# Patient Record
Sex: Female | Born: 1995 | Race: Black or African American | Hispanic: No | Marital: Single | Smoking: Never smoker
Health system: Southern US, Community
[De-identification: ages and names within clinical notes are randomized; demographics above are authoritative.]

## PROBLEM LIST (undated history)

## (undated) ENCOUNTER — Inpatient Hospital Stay (HOSPITAL_COMMUNITY): Payer: Self-pay

## (undated) ENCOUNTER — Ambulatory Visit (HOSPITAL_COMMUNITY)

## (undated) ENCOUNTER — Emergency Department (HOSPITAL_BASED_OUTPATIENT_CLINIC_OR_DEPARTMENT_OTHER): Admission: EM | Payer: Medicare (Managed Care)

## (undated) DIAGNOSIS — F419 Anxiety disorder, unspecified: Secondary | ICD-10-CM

## (undated) DIAGNOSIS — D649 Anemia, unspecified: Secondary | ICD-10-CM

## (undated) DIAGNOSIS — K219 Gastro-esophageal reflux disease without esophagitis: Secondary | ICD-10-CM

## (undated) DIAGNOSIS — R55 Syncope and collapse: Secondary | ICD-10-CM

## (undated) DIAGNOSIS — J029 Acute pharyngitis, unspecified: Secondary | ICD-10-CM

## (undated) DIAGNOSIS — G4730 Sleep apnea, unspecified: Secondary | ICD-10-CM

## (undated) DIAGNOSIS — G43909 Migraine, unspecified, not intractable, without status migrainosus: Secondary | ICD-10-CM

## (undated) DIAGNOSIS — H903 Sensorineural hearing loss, bilateral: Secondary | ICD-10-CM

## (undated) DIAGNOSIS — R509 Fever, unspecified: Secondary | ICD-10-CM

## (undated) DIAGNOSIS — N83209 Unspecified ovarian cyst, unspecified side: Secondary | ICD-10-CM

## (undated) DIAGNOSIS — B999 Unspecified infectious disease: Secondary | ICD-10-CM

## (undated) DIAGNOSIS — Z9621 Cochlear implant status: Secondary | ICD-10-CM

## (undated) HISTORY — PX: COCHLEAR IMPLANT: SUR684

## (undated) HISTORY — DX: Acute pharyngitis, unspecified: J02.9

## (undated) HISTORY — DX: Anemia, unspecified: D64.9

## (undated) HISTORY — DX: Gastro-esophageal reflux disease without esophagitis: K21.9

## (undated) HISTORY — DX: Sleep apnea, unspecified: G47.30

## (undated) HISTORY — DX: Anxiety disorder, unspecified: F41.9

## (undated) HISTORY — DX: Syncope and collapse: R55

## (undated) HISTORY — DX: Fever, unspecified: R50.9

---
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
## 2013-01-01 ENCOUNTER — Other Ambulatory Visit: Payer: Self-pay | Admitting: Licensed Clinical Social Worker

## 2013-01-01 ENCOUNTER — Inpatient Hospital Stay (HOSPITAL_COMMUNITY)

## 2013-01-01 ENCOUNTER — Encounter (HOSPITAL_COMMUNITY): Payer: Self-pay | Admitting: Radiology

## 2013-01-01 DIAGNOSIS — R509 Fever, unspecified: Secondary | ICD-10-CM

## 2013-01-01 DIAGNOSIS — Z9621 Cochlear implant status: Secondary | ICD-10-CM

## 2013-01-01 DIAGNOSIS — J029 Acute pharyngitis, unspecified: Secondary | ICD-10-CM

## 2013-01-01 DIAGNOSIS — R519 Headache, unspecified: Secondary | ICD-10-CM

## 2013-01-01 DIAGNOSIS — R55 Syncope and collapse: Secondary | ICD-10-CM

## 2013-01-01 DIAGNOSIS — B370 Candidal stomatitis: Secondary | ICD-10-CM

## 2013-01-01 DIAGNOSIS — R51 Headache: Secondary | ICD-10-CM

## 2013-01-01 DIAGNOSIS — H9209 Otalgia, unspecified ear: Secondary | ICD-10-CM

## 2013-01-01 HISTORY — DX: Acute pharyngitis, unspecified: J02.9

## 2013-01-01 HISTORY — DX: Fever, unspecified: R50.9

## 2013-01-01 HISTORY — DX: Syncope and collapse: R55

## 2013-01-01 LAB — CBC
Hemoglobin: 10.6 g/dL — ABNORMAL LOW (ref 12.0–15.0)
MCH: 28.9 pg (ref 26.0–34.0)
MCHC: 32.1 g/dL (ref 30.0–36.0)
MCV: 89.9 fL (ref 78.0–100.0)

## 2013-01-01 LAB — RHEUMATOID FACTOR: Rhuematoid fact SerPl-aCnc: 17 IU/mL — ABNORMAL HIGH (ref <=14)

## 2013-01-01 LAB — CULTURE, GROUP A STREP

## 2013-01-01 LAB — C-REACTIVE PROTEIN: CRP: 17.1 mg/dL — ABNORMAL HIGH (ref <0.60)

## 2013-01-01 LAB — BASIC METABOLIC PANEL
BUN: 4 mg/dL — ABNORMAL LOW (ref 6–23)
CO2: 24 mEq/L (ref 19–32)
Calcium: 8.4 mg/dL (ref 8.4–10.5)
Creatinine, Ser: 0.91 mg/dL (ref 0.50–1.10)
GFR calc non Af Amer: >90 mL/min (ref >90)
Glucose, Bld: 95 mg/dL (ref 70–99)
Sodium: 135 mEq/L (ref 135–145)

## 2013-01-01 LAB — ROCKY MTN SPOTTED FVR AB, IGG-BLOOD: RMSF IgG: 0.35 IV

## 2013-01-01 LAB — ROCKY MTN SPOTTED FVR AB, IGM-BLOOD: RMSF IgM: 0.36 IV (ref 0.00–0.89)

## 2013-01-01 MED ORDER — IOHEXOL 300 MG/ML  SOLN
100.0000 mL | Freq: Once | INTRAMUSCULAR | Status: AC | PRN
  Administered 2013-01-01: 100 mL via INTRAVENOUS

## 2013-01-01 MED ORDER — IBUPROFEN 600 MG PO TABS: 600.0000 mg | ORAL_TABLET | ORAL | Status: DC | PRN

## 2013-01-01 MED ORDER — POTASSIUM CHLORIDE CRYS ER 20 MEQ PO TBCR
40.0000 meq | EXTENDED_RELEASE_TABLET | Freq: Once | ORAL | Status: AC
  Administered 2013-01-01: 40 meq via ORAL
  Filled 2013-01-01: qty 2

## 2013-01-01 MED ORDER — LEVOFLOXACIN 750 MG PO TABS
750.0000 mg | ORAL_TABLET | Freq: Every day | ORAL | Status: DC
  Administered 2013-01-01: 750 mg via ORAL
  Filled 2013-01-01: qty 1

## 2013-01-01 MED ORDER — FLUCONAZOLE 200 MG PO TABS
200.0000 mg | ORAL_TABLET | Freq: Every day | ORAL | Status: DC
  Administered 2013-01-01: 200 mg via ORAL
  Filled 2013-01-01: qty 1

## 2013-01-01 MED ORDER — FLUCONAZOLE 200 MG PO TABS: 200.0000 mg | ORAL_TABLET | Freq: Every day | ORAL | Status: DC

## 2013-01-01 MED ORDER — LEVOFLOXACIN 750 MG PO TABS: 750.0000 mg | ORAL_TABLET | Freq: Every day | ORAL | Status: DC

## 2013-01-01 MED ORDER — OXYCODONE-ACETAMINOPHEN 5-325 MG PO TABS: 1.0000 | ORAL_TABLET | ORAL | Status: DC | PRN

## 2013-01-01 NOTE — Consult Note (Signed)
[facility]    Date of Admission:  12/30/2012  Date of Consult:  01/01/2013  Reason for Consult: FUO Referring Physician: Dr. Aldine Contes   HPI: Michele Dixon is an 17 y.o. female with hx of Cochlear implants right side, deafness, who had a week history of malaise, sinus congestion and a sore throat. She was attending a ceremony in her church for her upcoming [employer] graduation when she began feeling worsening malaise, dizziness, headache, light headedness. She began trying to go to the restroom to get out of everyones way when she had a frank syncopal event with collpase. She came to [facility] ED and was found to be febrile to 103.1. On exam she had exudative pharyngitis and Group A strep rapid done negative, along with cultures. She c./o swollen LN in her cervical chain and pain along her cochlear implant and in her right ear which was found to have erythematous canal on exam in ED. She had CXR done, unrevealing, UA neg, blood and urine cultures taken which were negative.  Monspot was negative. She had CT chest abdomen and pelvis all of whiich were completely unremarkable. She had HIV EIA done negative. RMSF IGG negative. Her labs were significant for leukocytosis to 17.6 kl, with PMN predominance, which improved to 14.8k without abx. Yesterday she was started on vancomycin and zosyn prior to imaging and continued overnight. Her fever curve has also improved though she is getting tylenol and ibuprofen. She still has sore throat and difficulty swallowing. She is anxious to attend her [employer] graduation ceremony tomorrow and has very strong desire to be dc today from the the hospital.  I spent greater than 60 minutes with the patient including greater than 50% of time in face to face counsel of the patient with mother as translator in sign language re FUO and her clinical condition  and in coordination of their care.    Past Medical History  Diagnosis Date  . Migraines     . Cochlear implant in place     Past Surgical History  Procedure Laterality Date  . Cochlear implant    ergies:   No Known Allergies   Medications: I have reviewed patients current medications as documented in Epic Anti-infectives   Start     Dose/Rate Route Frequency Ordered Stop   01/01/13 1400  levofloxacin (LEVAQUIN) tablet 750 mg     750 mg Oral Daily 01/01/13 1130     01/01/13 1400  fluconazole (DIFLUCAN) tablet 200 mg     200 mg Oral Daily 01/01/13 1130     12/31/12 1600  piperacillin-tazobactam (ZOSYN) IVPB 3.375 g  Status:  Discontinued     3.375 g 12.5 mL/hr over 240 Minutes Intravenous Every 8 hours 12/31/12 1431 01/01/13 1129   12/31/12 1500  vancomycin (VANCOCIN) IVPB 1000 mg/200 mL premix  Status:  Discontinued     1,000 mg 200 mL/hr over 60 Minutes Intravenous 3 times per day 12/31/12 1431 01/01/13 1129   12/30/12 2345  cefTRIAXone (ROCEPHIN) 1 g in dextrose 5 % 50 mL IVPB  Status:  Discontinued     1 g 100 mL/hr over 30 Minutes Intravenous  Once 12/30/12 2332 12/31/12 0008   12/30/12 2330  doxycycline (VIBRAMYCIN) 100 mg in dextrose 5 % 250 mL IVPB  Status:  Discontinued     100 mg 125 mL/hr over 120 Minutes Intravenous  Once 12/30/12 2316 12/31/12 0008      Social History:  reports that she  has never smoked. She does not have any smokeless tobacco history on file. She reports that  drinks alcohol. She reports that she does not use illicit drugs.  Family History  Problem Relation Age of Onset  . Hypertension Mother   . Hyperlipidemia Father   . Prostate cancer Father     As in HPI and primary teams notes otherwise 12 point review of systems is negative  Blood pressure 114/70, pulse 106, temperature 98.4 F (36.9 C), temperature source Oral, resp. rate 20, height 5\' 4"  (1.626 m), weight 137 lb 8 oz (62.37 kg), last menstrual period 12/24/2012, SpO2 98.00%. General: Alert and awake, oriented x3, not in any acute distress. HEENT: anicteric sclera, pupils  reactive to light and accommodation, EOMI,  She has minimal tenderness around the cochlear implant none of the external ear  OP with frank exudate int he posterior oropharynx, and coating of tongue c/w thrush   CVS regular rate, normal r,  no murmur rubs or gallops Chest: clear to auscultation bilaterally, no wheezing, rales or rhonchi Abdomen: soft nontender, nondistended, normal bowel sounds, Extremities: no  clubbing or edema noted bilaterally Skin: no rashes LN: she has shoddy tender cervical LA Neuro: nonfocal, strength and sensation intact   Results for orders placed during the hospital encounter of 12/30/12 (from the past 48 hour(s))  CBC WITH DIFFERENTIAL     Status: Abnormal   Collection Time    12/30/12  8:03 PM      Result Value Range   WBC 17.6 (*) 4.0 - 10.5 K/uL   RBC 4.00  3.87 - 5.11 MIL/uL   Hemoglobin 11.9 (*) 12.0 - 15.0 g/dL   HCT 16.1 (*) 09.6 - 04.5 %   MCV 88.8  78.0 - 100.0 fL   MCH 29.8  26.0 - 34.0 pg   MCHC 33.5  30.0 - 36.0 g/dL   RDW 40.9  81.1 - 91.4 %   Platelets 199  150 - 400 K/uL   Neutrophils Relative % 94 (*) 43 - 77 %   Neutro Abs 16.5 (*) 1.7 - 7.7 K/uL   Lymphocytes Relative 2 (*) 12 - 46 %   Lymphs Abs 0.4 (*) 0.7 - 4.0 K/uL   Monocytes Relative 4  3 - 12 %   Monocytes Absolute 0.7  0.1 - 1.0 K/uL   Eosinophils Relative 0  0 - 5 %   Eosinophils Absolute 0.0  0.0 - 0.7 K/uL   Basophils Relative 0  0 - 1 %   Basophils Absolute 0.0  0.0 - 0.1 K/uL  COMPREHENSIVE METABOLIC PANEL     Status: Abnormal   Collection Time    12/30/12  8:03 PM      Result Value Range   Sodium 131 (*) 135 - 145 mEq/L   Potassium 3.5  3.5 - 5.1 mEq/L   Chloride 95 (*) 96 - 112 mEq/L   CO2 23  19 - 32 mEq/L   Glucose, Bld 122 (*) 70 - 99 mg/dL   BUN 10  6 - 23 mg/dL   Creatinine, Ser 7.82  0.50 - 1.10 mg/dL   Calcium 9.0  8.4 - 95.6 mg/dL   Total Protein 7.5  6.0 - 8.3 g/dL   Albumin 3.8  3.5 - 5.2 g/dL   AST 16  0 - 37 U/L   ALT 14  0 - 35 U/L    Alkaline Phosphatase 60  39 - 117 U/L   Total Bilirubin 0.7  0.3 - 1.2 mg/dL  GFR calc non Af Amer >90  >90 mL/min   GFR calc Af Amer >90  >90 mL/min   Comment:            The eGFR has been calculated     using the CKD EPI equation.     This calculation has not been     validated in all clinical     situations.     eGFR's persistently     <90 mL/min signify     possible Chronic Kidney Disease.  GLUCOSE, CAPILLARY     Status: Abnormal   Collection Time    12/30/12  8:13 PM      Result Value Range   Glucose-Capillary 121 (*) 70 - 99 mg/dL   Comment 1 Notify RN    RAPID STREP SCREEN     Status: None   Collection Time    12/30/12  8:46 PM      Result Value Range   Streptococcus, Group A Screen (Direct) NEGATIVE  NEGATIVE   Comment: (NOTE)     A Rapid Antigen test may result negative if the antigen level in the     sample is below the detection level of this test. The FDA has not     cleared this test as a stand-alone test therefore the rapid antigen     negative result has reflexed to a Group A Strep culture.  CULTURE, GROUP A STREP     Status: None   Collection Time    12/30/12  8:46 PM      Result Value Range   Specimen Description THROAT     Special Requests NONE     Culture NO SUSPICIOUS COLONIES, CONTINUING TO HOLD     Report Status PENDING    URINALYSIS, ROUTINE W REFLEX MICROSCOPIC     Status: Abnormal   Collection Time    12/30/12  9:22 PM      Result Value Range   Color, Urine YELLOW  YELLOW   APPearance CLEAR  CLEAR   Specific Gravity, Urine 1.007  1.005 - 1.030   pH 6.5  5.0 - 8.0   Glucose, UA NEGATIVE  NEGATIVE mg/dL   Hgb urine dipstick NEGATIVE  NEGATIVE   Bilirubin Urine NEGATIVE  NEGATIVE   Ketones, ur NEGATIVE  NEGATIVE mg/dL   Protein, ur NEGATIVE  NEGATIVE mg/dL   Urobilinogen, UA 0.2  0.0 - 1.0 mg/dL   Nitrite NEGATIVE  NEGATIVE   Leukocytes, UA SMALL (*) NEGATIVE  URINE MICROSCOPIC-ADD ON     Status: None   Collection Time    12/30/12  9:22 PM       Result Value Range   Squamous Epithelial / LPF RARE  RARE   WBC, UA 3-6  <3 WBC/hpf   Bacteria, UA RARE  RARE  POCT PREGNANCY, URINE     Status: None   Collection Time    12/30/12  9:28 PM      Result Value Range   Preg Test, Ur NEGATIVE  NEGATIVE   Comment:            THE SENSITIVITY OF THIS     METHODOLOGY IS >24 mIU/mL  MONONUCLEOSIS SCREEN     Status: None   Collection Time    12/30/12 10:10 PM      Result Value Range   Mono Screen NEGATIVE  NEGATIVE  ROCKY MTN SPOTTED FVR AB, IGG-BLOOD     Status: None   Collection Time    12/31/12 12:03  AM      Result Value Range   RMSF IgG 0.35     Comment: (NOTE)     IV = Index Value     REFERENCE INTERVAL     [facility] Spotted Fever Antibody, IgG     IgG (IV)     Result              Interpretation:     ---------   ---------             ---------------     <0.80       Negative    No significant level of Rickettsia rickettsii                            IgG antibody detected. If clinically                            indicated, repeat sample within 7-14 days.     0.80-1.20   Equivocal   Questionable presence of Rickettsia rickettsii                            IgG detected. Recommend recollecting                            and retesting, if clinically indicated.     >1.20       Positive    Presence of IgG antibody to Rickettsia                            rickettsii detected.  IgG indicates evidence                            of prior infection and may not indicate                            active disease.  ROCKY MTN SPOTTED FVR AB, IGM-BLOOD     Status: None   Collection Time    12/31/12 12:03 AM      Result Value Range   RMSF IgM 0.36  0.00 - 0.89 IV   Comment: (NOTE)     IV = Index Value     REFERENCE INTERVAL     [facility] Spotted Fever Antibody, IgM     IgM (IV)   Result                Interpretation:     ---------  ---------              ---------------     <0.90      Negative     No significant level of  Rickettsia rickettsii                            IgM antibody detected. If clinically                            indicated, repeat sample within 7-14 days.     0.90-1.10  Equivocal    Questionable presence of Rickettsia rickettsii  IgM antibody detected.  Recommend recollecting                            and retesting, if clinically indicated.     >1.10      Positive     Presence of IgM antibody to Rickettsia                            rickettsii detected.  IgM suggestive of                            current or recent infection.     The RMSF IgM test was validated and its performance characteristics     determined by [employer].  It has not been cleared     or approved by the U.S. Food and Drug Administration.  The FDA has     determined that such clearance or approval is not necessary.  This     test is used for clinical purposes.  It should not be regarded as     investigational or for research.  BASIC METABOLIC PANEL     Status: None   Collection Time    12/31/12  4:25 AM      Result Value Range   Sodium 139  135 - 145 mEq/L   Comment: DELTA CHECK NOTED     REPEATED TO VERIFY   Potassium 3.5  3.5 - 5.1 mEq/L   Chloride 107  96 - 112 mEq/L   Comment: DELTA CHECK NOTED     REPEATED TO VERIFY   CO2 25  19 - 32 mEq/L   Glucose, Bld 93  70 - 99 mg/dL   BUN 7  6 - 23 mg/dL   Creatinine, Ser 1.61  0.50 - 1.10 mg/dL   Calcium 8.4  8.4 - 09.6 mg/dL   GFR calc non Af Amer >90  >90 mL/min   GFR calc Af Amer >90  >90 mL/min   Comment:            The eGFR has been calculated     using the CKD EPI equation.     This calculation has not been     validated in all clinical     situations.     eGFR's persistently     <90 mL/min signify     possible Chronic Kidney Disease.  CBC     Status: Abnormal   Collection Time    12/31/12  4:25 AM      Result Value Range   WBC 14.8 (*) 4.0 - 10.5 K/uL   RBC 3.61 (*) 3.87 - 5.11 MIL/uL   Hemoglobin 10.4 (*)  12.0 - 15.0 g/dL   HCT 04.5 (*) 40.9 - 81.1 %   MCV 89.2  78.0 - 100.0 fL   MCH 28.8  26.0 - 34.0 pg   MCHC 32.3  30.0 - 36.0 g/dL   RDW 91.4  78.2 - 95.6 %   Platelets 182  150 - 400 K/uL  PROCALCITONIN     Status: None   Collection Time    12/31/12  4:25 AM      Result Value Range   Procalcitonin 1.63     Comment:            Interpretation:     PCT > 0.5 ng/mL and <= 2 ng/mL:  Systemic infection (sepsis) is possible,     but other conditions are known to elevate     PCT as well.     (NOTE)             ICU PCT Algorithm               Non ICU PCT Algorithm        ----------------------------     ------------------------------             PCT < 0.25 ng/mL                 PCT < 0.1 ng/mL         Stopping of antibiotics            Stopping of antibiotics           strongly encouraged.               strongly encouraged.        ----------------------------     ------------------------------           PCT level decrease by               PCT < 0.25 ng/mL           >= 80% from peak PCT           OR PCT 0.25 - 0.5 ng/mL          Stopping of antibiotics                                                 encouraged.         Stopping of antibiotics               encouraged.        ----------------------------     ------------------------------           PCT level decrease by              PCT >= 0.25 ng/mL           < 80% from peak PCT            AND PCT >= 0.5 ng/mL            Continuing antibiotics                                                  encouraged.           Continuing antibiotics                encouraged.        ----------------------------     ------------------------------         PCT level increase compared          PCT > 0.5 ng/mL             with peak PCT AND              PCT >= 0.5 ng/mL             Escalation of antibiotics  strongly encouraged.          Escalation of antibiotics            strongly encouraged.  CULTURE,  BLOOD (ROUTINE X 2)     Status: None   Collection Time    12/31/12  9:10 AM      Result Value Range   Specimen Description BLOOD LEFT HAND     Special Requests BOTTLES DRAWN AEROBIC AND ANAEROBIC 7CC     Culture  Setup Time 12/31/2012 12:35     Culture       Value:        BLOOD CULTURE RECEIVED NO GROWTH TO DATE CULTURE WILL BE HELD FOR 5 DAYS BEFORE ISSUING A FINAL NEGATIVE REPORT   Report Status PENDING    HIV ANTIBODY (ROUTINE TESTING)     Status: None   Collection Time    12/31/12  9:10 AM      Result Value Range   HIV NON REACTIVE  NON REACTIVE  CULTURE, BLOOD (ROUTINE X 2)     Status: None   Collection Time    12/31/12  9:40 AM      Result Value Range   Specimen Description BLOOD LEFT ARM     Special Requests BOTTLES DRAWN AEROBIC AND ANAEROBIC 4CC     Culture  Setup Time 12/31/2012 12:35     Culture       Value:        BLOOD CULTURE RECEIVED NO GROWTH TO DATE CULTURE WILL BE HELD FOR 5 DAYS BEFORE ISSUING A FINAL NEGATIVE REPORT   Report Status PENDING    CBC     Status: Abnormal   Collection Time    01/01/13  4:07 AM      Result Value Range   WBC 11.4 (*) 4.0 - 10.5 K/uL   RBC 3.67 (*) 3.87 - 5.11 MIL/uL   Hemoglobin 10.6 (*) 12.0 - 15.0 g/dL   HCT 57.8 (*) 46.9 - 62.9 %   MCV 89.9  78.0 - 100.0 fL   MCH 28.9  26.0 - 34.0 pg   MCHC 32.1  30.0 - 36.0 g/dL   RDW 52.8  41.3 - 24.4 %   Platelets 165  150 - 400 K/uL  BASIC METABOLIC PANEL     Status: Abnormal   Collection Time    01/01/13  4:07 AM      Result Value Range   Sodium 135  135 - 145 mEq/L   Potassium 3.3 (*) 3.5 - 5.1 mEq/L   Chloride 102  96 - 112 mEq/L   CO2 24  19 - 32 mEq/L   Glucose, Bld 95  70 - 99 mg/dL   BUN 4 (*) 6 - 23 mg/dL   Creatinine, Ser 0.10  0.50 - 1.10 mg/dL   Calcium 8.4  8.4 - 27.2 mg/dL   GFR calc non Af Amer >90  >90 mL/min   GFR calc Af Amer >90  >90 mL/min   Comment:            The eGFR has been calculated     using the CKD EPI equation.     This calculation has not been      validated in all clinical     situations.     eGFR's persistently     <90 mL/min signify     possible Chronic Kidney Disease.      Component Value Date/Time   SDES BLOOD LEFT ARM 12/31/2012 0940  SPECREQUEST BOTTLES DRAWN AEROBIC AND ANAEROBIC 4CC 12/31/2012 0940   CULT        BLOOD CULTURE RECEIVED NO GROWTH TO DATE CULTURE WILL BE HELD FOR 5 DAYS BEFORE ISSUING A FINAL NEGATIVE REPORT 12/31/2012 0940   REPTSTATUS PENDING 12/31/2012 0940   Dg Chest 2 View  12/30/2012   *RADIOLOGY REPORT*  Clinical Data: Shortness of breath.  Fever.  CHEST - 2 VIEW  Comparison: No infiltrate, congestive heart failure or pneumothorax.None.  Findings: Minimal peribronchial thickening may be reactive in origin.  No segmental infiltrate or pneumothorax.  No congestive heart failure.  Heart size within normal limits.  IMPRESSION: Minimal peribronchial thickening.   Original Report Authenticated By: Lacy Duverney, M.D.   Ct Chest W Contrast  12/31/2012   *RADIOLOGY REPORT*  Clinical Data:  Fever of unknown origin with leukocytosis. Abdominal and back pain.  CT CHEST, ABDOMEN AND PELVIS WITH CONTRAST  Technique:  Multidetector CT imaging of the chest, abdomen and pelvis was performed following the standard protocol during bolus administration of intravenous contrast.  Contrast: 80mL OMNIPAQUE IOHEXOL 300 MG/ML  SOLN  Comparison:  Prior CT of the abdomen pelvis on 10/11/2006.  CT CHEST  Findings:  There is no evidence of pulmonary infiltrate, nodule or edema.  No enlarged lymph nodes are identified.  The heart size and mediastinum are within normal limits.  No pleural or pericardial fluid is seen.  The bony thorax is within normal limits.  IMPRESSION: Normal CT of the chest.  CT ABDOMEN AND PELVIS  Findings:  The liver, spleen, gallbladder, pancreas, adrenal glands and kidneys are within normal limits.  Bowel loops are normal caliber and show no evidence of obstruction or inflammation.  No abscess is identified.  A small  amount of free fluid is identified in the pelvis posterior to the uterus.  This may be physiologic.  No adnexal masses are seen.  The bladder is unremarkable.  No hernias are identified.  No vascular abnormalities are seen.  Bony structures are within normal limits.  No abnormal calcifications are identified.  IMPRESSION: No acute findings in the abdomen pelvis.  Small amount of free fluid in the pelvis is likely physiologic.   Original Report Authenticated By: Irish Lack, M.D.   Ct Lumbar Spine W Contrast  12/31/2012   *RADIOLOGY REPORT*  Clinical Data: 18 year old female with bilateral lower back pain, fever, acute onset symptoms with syncope.  CT LUMBAR SPINE WITH CONTRAST  Technique:  Multidetector CT imaging of the lumbar spine was performed with intravenous contrast administration. Multiplanar CT image reconstructions were also generated.  Contrast: OMNIPAQUE IOHEXOL 300 MG/ML  SOLN  Comparison: CT abdomen and pelvis 10/11/2006.  Findings: For the purposes of this report, the lowest open disc space will be L5-S1.  By this designation, there are ribs at the L1 level.  Bone mineralization is within normal limits.  Vertebral height and alignment within normal limits (stable trace retrolisthesis of L5 on S1.  Lumbar end plates are intact.  Visualized sacrum and SI joints are within normal limits. No acute osseous abnormality identified.  Relatively preserved lumbar disc spaces.  Capacious lower thoracic and lumbar spinal canal.  No lumbar spinal stenosis.  No focal disc herniation identified.  Normal lumbar epidural space.  Stable and negative visualized abdominal viscera.  No hydronephrosis.  Visualized paraspinal soft tissues are within normal limits.  IMPRESSION: 1.  Negative CT appearance of the lumbar spine. 2.  Transitional anatomy.  For the purposes of this report the lowest  open disc space is designated L5-S1, which results in ribs at the L1 level.   Original Report Authenticated By: Erskine Speed, M.D.   Ct Abdomen Pelvis W Contrast  12/31/2012   *RADIOLOGY REPORT*  Clinical Data:  Fever of unknown origin with leukocytosis. Abdominal and back pain.  CT CHEST, ABDOMEN AND PELVIS WITH CONTRAST  Technique:  Multidetector CT imaging of the chest, abdomen and pelvis was performed following the standard protocol during bolus administration of intravenous contrast.  Contrast: 80mL OMNIPAQUE IOHEXOL 300 MG/ML  SOLN  Comparison:  Prior CT of the abdomen pelvis on 10/11/2006.  CT CHEST  Findings:  There is no evidence of pulmonary infiltrate, nodule or edema.  No enlarged lymph nodes are identified.  The heart size and mediastinum are within normal limits.  No pleural or pericardial fluid is seen.  The bony thorax is within normal limits.  IMPRESSION: Normal CT of the chest.  CT ABDOMEN AND PELVIS  Findings:  The liver, spleen, gallbladder, pancreas, adrenal glands and kidneys are within normal limits.  Bowel loops are normal caliber and show no evidence of obstruction or inflammation.  No abscess is identified.  A small amount of free fluid is identified in the pelvis posterior to the uterus.  This may be physiologic.  No adnexal masses are seen.  The bladder is unremarkable.  No hernias are identified.  No vascular abnormalities are seen.  Bony structures are within normal limits.  No abnormal calcifications are identified.  IMPRESSION: No acute findings in the abdomen pelvis.  Small amount of free fluid in the pelvis is likely physiologic.   Original Report Authenticated By: Irish Lack, M.D.     Recent Results (from the past 720 hour(s))  RAPID STREP SCREEN     Status: None   Collection Time    12/30/12  8:46 PM      Result Value Range Status   Streptococcus, Group A Screen (Direct) NEGATIVE  NEGATIVE Final   Comment: (NOTE)     A Rapid Antigen test may result negative if the antigen level in the     sample is below the detection level of this test. The FDA has not     cleared this test as a  stand-alone test therefore the rapid antigen     negative result has reflexed to a Group A Strep culture.  CULTURE, GROUP A STREP     Status: None   Collection Time    12/30/12  8:46 PM      Result Value Range Status   Specimen Description THROAT   Final   Special Requests NONE   Final   Culture NO SUSPICIOUS COLONIES, CONTINUING TO HOLD   Final   Report Status PENDING   Incomplete  CULTURE, BLOOD (ROUTINE X 2)     Status: None   Collection Time    12/31/12  9:10 AM      Result Value Range Status   Specimen Description BLOOD LEFT HAND   Final   Special Requests BOTTLES DRAWN AEROBIC AND ANAEROBIC 7CC   Final   Culture  Setup Time 12/31/2012 12:35   Final   Culture     Final   Value:        BLOOD CULTURE RECEIVED NO GROWTH TO DATE CULTURE WILL BE HELD FOR 5 DAYS BEFORE ISSUING A FINAL NEGATIVE REPORT   Report Status PENDING   Incomplete  CULTURE, BLOOD (ROUTINE X 2)     Status: None   Collection  Time    12/31/12  9:40 AM      Result Value Range Status   Specimen Description BLOOD LEFT ARM   Final   Special Requests BOTTLES DRAWN AEROBIC AND ANAEROBIC 4CC   Final   Culture  Setup Time 12/31/2012 12:35   Final   Culture     Final   Value:        BLOOD CULTURE RECEIVED NO GROWTH TO DATE CULTURE WILL BE HELD FOR 5 DAYS BEFORE ISSUING A FINAL NEGATIVE REPORT   Report Status PENDING   Incomplete     Impression/Recommendation  17 year old with cochlear implant with admission with syncope and FUO.  #1 FUO: Her exudative pharyngitis, coating on tongue and cervical LA would point to most likely culprits here. Her GAS screen and culture NGTD. Gonorrhea was not tested for on culture but would not likely grow now that she is on zosyn. Other items high in differential would be EBV and CMV (mono spot not esp sensitive), though with former splenomegaly might be expected. Certainly ACUTE HIV needs to be considered. Toxoplasmosis much less liklely. She certainly could have thrush but very strange  for her to have this NOW PRIOR to abx and without known immune compromise, unless we are dealing with acute HIV with fulminant drop in CD4. Other items to consider would be infection of her cochlear implant and her inner ear. Bacterial meningitis, viral meningitis seem much less likely  --I will see if RN can run GC probe on throat for amplification sensi not good on zosyn already --I will check HIV RNA, CD4,  --EBV abs, CMV abs --Toxoplasma IgG and IgM --I will get CT with contrast of head to look at cochlear implant sites --I will dc the zosyn and vancomycin  --I will start her levaquin 750mg  daily to cover for possibility of bacterial infection of ear/cochlear implant, other bacterial causes of pharyngitis though not good vs GC  --I will start with fluconazole to treat for thrush  #2 Cervical LA: see above  #3 Exudative pharyngitis and coating on tongue'; see above  #4 SYncope: likely due to dehydration, this along with uncertainty re her diagnosis is the main thing that concerns me re her current illness. If she can stand up without frank worrisome orthostatic vital signs, can take fluids and walk safely I would NOT OPPOSE her going home  As long as she and her mother understand risks to leaving the monitored setting of the hospital.  If she goes home we would MOST definitely like to see her back in our clinic NEXT week.    Thank you so much for this interesting consult  [facility] [phone] (pager) [phone] (office) 01/01/2013, 12:12 PM  Paulette Blanch [place] 01/01/2013, 12:12 PM

## 2013-01-01 NOTE — Plan of Care (Signed)
Problem: Discharge Progression Outcomes Goal: Discharge plan in place and appropriate Outcome: Completed/Met Date Met:  01/01/13 Patient family to call doctor tomorrow with results of tests.

## 2013-01-01 NOTE — Discharge Summary (Signed)
Physician Discharge Summary  Jarika Robben NWG:[id number] DOB: [birth year] DOA: 12/30/2012  PCP: No primary provider on file.  Admit date: 12/30/2012 Discharge date: 01/01/2013  Recommendations for Outpatient Follow-up:  1. Pt will need to follow up with PCP in 2-3 weeks post discharge 2. Please obtain BMP to evaluate electrolytes and kidney function, potassium 3. Please note that pt has received 40 MEQ of KDUR prior to discharge  4. Please also check CBC to evaluate Hg and Hct levels 5. Please note that pt has elected to leave prior to all the tests results being available 6. She and her mother have verbalized understanding and know they have to have schedule follow up appointment with ID to go over the test results including EBV panel, CMV, hepatitis panel, HIV, CD4#, viral load  Discharge Diagnoses: Fever of unknown origin  Active Problems:   Fever and chills   Leukocytosis   Hyponatremia   Acute low back pain   Tachycardia   Exudative pharyngitis   Thrush   FUO (fever of unknown origin)   Syncope and collapse   Cochlear implant in place   Otalgia   Headache(784.0)  Discharge Condition: Stable  Diet recommendation: Heart healthy diet discussed in details   History of present illness:  Pt is 17 y.o. female with past medical history of congenital deafness with a cochlear implant more than 5 years prior to admission that comes in for loss of consciousness for less than 10 seconds. She did not hit her head as she was sitting down. She relates she's never felt like this before has been feeling weak with headache and low back pain that started on the day of admission she relates her back pain has progressively gotten worse. She relates no trauma or falls, no unilateral motor or any weakness or pain. No burning when she urinates no vaginal discharge, cough, no shortness of breath, no palpitations, chest pain no dysuria. She denies any urological or gastrointestinal procedures.   In the  ED:  She was found to have a fever of 103.1, her CBC showed leukocytosis of 17 with a left shift, she was found to be mildly hyponatremic on basic metabolic panel, chest x-ray was done that showed mild peribronchial thickening but no infiltrates. UA did not show any signs of infection, strep throat was negative, urine pregnancy test was negative. So we were asked to admit and further evaluate.  Hospital Course:  Active Problems:   Fever and chills - unclear source, Ct of the back area negative for acute infectious etiology, Ct head, chest, abdomen and pelvis also negative - following panels ordered: EBV, CMV, hepatitis, HIV, viral load, toxoplasmosis, CD4# - pt was started on Vancomycin and Zosyn, IVF, antiemetics and analgesia as needed - she has responded well to the therapy and her T max overnight was 99.5 F - pt has elected to leave as she was feeling better but she ws made aware that we still don't know the reason and that several tests need to be followed - she has verbalized understanding - she was discharged on Levaquin as recommended by ID specialist and was made aware that she needs to schedule follow up with Dr. Daiva Eves in 1 week post discharge    Leukocytosis - likely secondary to principal problem but source unclear - must have follow up appointment to go over the test results as non available at this time   Hyponatremia - secondary to pre renal etiology - IVF provided and pt has responded well -  tolerating PO intake    Exudative pharyngitis - possible source of fever but unclear, strep test negative, mono test negative - pending tests as noted above    Thrush - unclear etiology and possibly related to the principal problem - follow up on EBV, CMV, HIV - diflucan prescribed upon discharge    Cochlear implant in place - no signs of infection per CT scan    Hypokalemia - mild and supplemented prior to discharge  - needs BMP to be checked in an outpatient setting    Procedures/Studies: Dg Chest 2 View 12/30/2012   *RADIOLOGY REPORT*  Clinical Data: Shortness of breath.  Fever.  CHEST - 2 VIEW  Comparison: No infiltrate, congestive heart failure or pneumothorax.None.  Findings: Minimal peribronchial thickening may be reactive in origin.  No segmental infiltrate or pneumothorax.  No congestive heart failure.  Heart size within normal limits.  IMPRESSION: Minimal peribronchial thickening.   Original Report Authenticated By: Lacy Duverney, M.D.   Ct Chest W Contrast 12/31/2012   *RADIOLOGY REPORT*  Clinical Data:  Fever of unknown origin with leukocytosis. Abdominal and back pain.  CT CHEST, ABDOMEN AND PELVIS WITH CONTRAST  Technique:  Multidetector CT imaging of the chest, abdomen and pelvis was performed following the standard protocol during bolus administration of intravenous contrast.  Contrast: 80mL OMNIPAQUE IOHEXOL 300 MG/ML  SOLN  Comparison:  Prior CT of the abdomen pelvis on 10/11/2006.  CT CHEST  Findings:  There is no evidence of pulmonary infiltrate, nodule or edema.  No enlarged lymph nodes are identified.  The heart size and mediastinum are within normal limits.  No pleural or pericardial fluid is seen.  The bony thorax is within normal limits.  IMPRESSION: Normal CT of the chest.  CT ABDOMEN AND PELVIS  Findings:  The liver, spleen, gallbladder, pancreas, adrenal glands and kidneys are within normal limits.  Bowel loops are normal caliber and show no evidence of obstruction or inflammation.  No abscess is identified.  A small amount of free fluid is identified in the pelvis posterior to the uterus.  This may be physiologic.  No adnexal masses are seen.  The bladder is unremarkable.  No hernias are identified.  No vascular abnormalities are seen.  Bony structures are within normal limits.  No abnormal calcifications are identified.  IMPRESSION: No acute findings in the abdomen pelvis.  Small amount of free fluid in the pelvis is likely physiologic.   Original  Report Authenticated By: Irish Lack, M.D.   Ct Lumbar Spine W Contrast 12/31/2012   *RADIOLOGY REPORT*  Clinical Data: 17 year old female with bilateral lower back pain, fever, acute onset symptoms with syncope.  CT LUMBAR SPINE WITH CONTRAST  Technique:  Multidetector CT imaging of the lumbar spine was performed with intravenous contrast administration. Multiplanar CT image reconstructions were also generated.  Contrast: OMNIPAQUE IOHEXOL 300 MG/ML  SOLN  Comparison: CT abdomen and pelvis 10/11/2006.  Findings: For the purposes of this report, the lowest open disc space will be L5-S1.  By this designation, there are ribs at the L1 level.  Bone mineralization is within normal limits.  Vertebral height and alignment within normal limits (stable trace retrolisthesis of L5 on S1.  Lumbar end plates are intact.  Visualized sacrum and SI joints are within normal limits. No acute osseous abnormality identified.  Relatively preserved lumbar disc spaces.  Capacious lower thoracic and lumbar spinal canal.  No lumbar spinal stenosis.  No focal disc herniation identified.  Normal lumbar epidural space.  Stable and negative visualized abdominal viscera.  No hydronephrosis.  Visualized paraspinal soft tissues are within normal limits.  IMPRESSION: 1.  Negative CT appearance of the lumbar spine. 2.  Transitional anatomy.  For the purposes of this report the lowest open disc space is designated L5-S1, which results in ribs at the L1 level.   Original Report Authenticated By: Erskine Speed, M.D.   Ct Abdomen Pelvis W Contrast 12/31/2012   *RADIOLOGY REPORT*  Clinical Data:  Fever of unknown origin with leukocytosis. Abdominal and back pain.  CT CHEST, ABDOMEN AND PELVIS WITH CONTRAST  Technique:  Multidetector CT imaging of the chest, abdomen and pelvis was performed following the standard protocol during bolus administration of intravenous contrast.  Contrast: 80mL OMNIPAQUE IOHEXOL 300 MG/ML  SOLN  Comparison:  Prior  CT of the abdomen pelvis on 10/11/2006.  CT CHEST  Findings:  There is no evidence of pulmonary infiltrate, nodule or edema.  No enlarged lymph nodes are identified.  The heart size and mediastinum are within normal limits.  No pleural or pericardial fluid is seen.  The bony thorax is within normal limits.  IMPRESSION: Normal CT of the chest.  CT ABDOMEN AND PELVIS  Findings:  The liver, spleen, gallbladder, pancreas, adrenal glands and kidneys are within normal limits.  Bowel loops are normal caliber and show no evidence of obstruction or inflammation.  No abscess is identified.  A small amount of free fluid is identified in the pelvis posterior to the uterus.  This may be physiologic.  No adnexal masses are seen.  The bladder is unremarkable.  No hernias are identified.  No vascular abnormalities are seen.  Bony structures are within normal limits.  No abnormal calcifications are identified.  IMPRESSION: No acute findings in the abdomen pelvis.  Small amount of free fluid in the pelvis is likely physiologic.   Original Report Authenticated By: Irish Lack, M.D.   Consultations:  ID  Antibiotics:  Levaquin - prescribed on discharge   Diflucan - prescribed on discharge   Discharge Exam: Filed Vitals:   01/01/13 1343  BP: 111/75  Pulse: 80  Temp: 99.1 F (37.3 C)  Resp: 16   Filed Vitals:   12/31/12 2235 01/01/13 0545 01/01/13 1002 01/01/13 1343  BP: 117/79 114/70  111/75  Pulse: 114 106  80  Temp: 100.5 F (38.1 C) 99.1 F (37.3 C) 98.4 F (36.9 C) 99.1 F (37.3 C)  TempSrc: Oral Oral    Resp: 20 20  16   Height:      Weight:      SpO2: 100% 98%  99%    General: Pt is alert, follows commands appropriately, not in acute distress, oral thrush, pharyngeal erythema and edema  Cardiovascular: Regular rate and rhythm, S1/S2 +, no murmurs, no rubs, no gallops Respiratory: Clear to auscultation bilaterally, no wheezing, no crackles, no rhonchi Abdominal: Soft, non tender, non  distended, bowel sounds +, no guarding Extremities: no edema, no cyanosis, pulses palpable bilaterally DP and PT Neuro: Grossly nonfocal  Discharge Instructions  Discharge Orders   Future Orders Complete By Expires     Diet - low sodium heart healthy  As directed     Increase activity slowly  As directed         Medication List    TAKE these medications       acetaminophen 500 MG tablet  Commonly known as:  TYLENOL  Take 1,000 mg by mouth every 6 (six) hours as needed for pain.  fluconazole 200 MG tablet  Commonly known as:  DIFLUCAN  Take 1 tablet (200 mg total) by mouth daily.     guaiFENesin 600 MG 12 hr tablet  Commonly known as:  MUCINEX  Take 1,200 mg by mouth 2 (two) times daily as needed for congestion.     ibuprofen 600 MG tablet  Commonly known as:  ADVIL,MOTRIN  Take 1 tablet (600 mg total) by mouth every 4 (four) hours as needed for pain or fever.     levofloxacin 750 MG tablet  Commonly known as:  LEVAQUIN  Take 1 tablet (750 mg total) by mouth daily.     oxyCODONE-acetaminophen 5-325 MG per tablet  Commonly known as:  PERCOCET/ROXICET  Take 1 tablet by mouth every 4 (four) hours as needed.           Follow-up Information   Follow up with Debbora Presto, MD In 1 day.   Contact information:   [address] [place] [address] [phone] cell phone [phone]        The results of significant diagnostics from this hospitalization (including imaging, microbiology, ancillary and laboratory) are listed below for reference.     Microbiology: Recent Results (from the past 240 hour(s))  RAPID STREP SCREEN     Status: None   Collection Time    12/30/12  8:46 PM      Result Value Range Status   Streptococcus, Group A Screen (Direct) NEGATIVE  NEGATIVE Final   Comment: (NOTE)     A Rapid Antigen test may result negative if the antigen level in the     sample is below the detection level of this test. The FDA has not     cleared  this test as a stand-alone test therefore the rapid antigen     negative result has reflexed to a Group A Strep culture.  CULTURE, GROUP A STREP     Status: None   Collection Time    12/30/12  8:46 PM      Result Value Range Status   Specimen Description THROAT   Final   Special Requests NONE   Final   Culture NO SUSPICIOUS COLONIES, CONTINUING TO HOLD   Final   Report Status PENDING   Incomplete  CULTURE, BLOOD (ROUTINE X 2)     Status: None   Collection Time    12/31/12  9:10 AM      Result Value Range Status   Specimen Description BLOOD LEFT HAND   Final   Special Requests BOTTLES DRAWN AEROBIC AND ANAEROBIC 7CC   Final   Culture  Setup Time 12/31/2012 12:35   Final   Culture     Final   Value:        BLOOD CULTURE RECEIVED NO GROWTH TO DATE CULTURE WILL BE HELD FOR 5 DAYS BEFORE ISSUING A FINAL NEGATIVE REPORT   Report Status PENDING   Incomplete  CULTURE, BLOOD (ROUTINE X 2)     Status: None   Collection Time    12/31/12  9:40 AM      Result Value Range Status   Specimen Description BLOOD LEFT ARM   Final   Special Requests BOTTLES DRAWN AEROBIC AND ANAEROBIC 4CC   Final   Culture  Setup Time 12/31/2012 12:35   Final   Culture     Final   Value:        BLOOD CULTURE RECEIVED NO GROWTH TO DATE CULTURE WILL BE HELD FOR 5 DAYS BEFORE ISSUING  A FINAL NEGATIVE REPORT   Report Status PENDING   Incomplete     Labs: Basic Metabolic Panel:  Recent Labs Lab 12/30/12 2003 12/31/12 0425 01/01/13 0407  NA 131* 139 135  K 3.5 3.5 3.3*  CL 95* 107 102  CO2 23 25 24   GLUCOSE 122* 93 95  BUN 10 7 4*  CREATININE 0.79 0.80 0.91  CALCIUM 9.0 8.4 8.4   Liver Function Tests:  Recent Labs Lab 12/30/12 2003  AST 16  ALT 14  ALKPHOS 60  BILITOT 0.7  PROT 7.5  ALBUMIN 3.8   CBC:  Recent Labs Lab 12/30/12 2003 12/31/12 0425 01/01/13 0407  WBC 17.6* 14.8* 11.4*  NEUTROABS 16.5*  --   --   HGB 11.9* 10.4* 10.6*  HCT 35.5* 32.2* 33.0*  MCV 88.8 89.2 89.9  PLT 199 182  165    CBG:  Recent Labs Lab 12/30/12 2013  GLUCAP 121*     SIGNED: Time coordinating discharge: Over 30 minutes  Debbora Presto, MD  [facility] 01/01/2013, 3:09 PM Pager [phone]  If 7PM-7AM, please contact night-coverage [email or web address] Password [email or web address]

## 2013-01-01 NOTE — Progress Notes (Signed)
Patient/family given all instructions with no questions.  Patient given a ride out to car, to be transported home by family.  Philomena Doheny RN

## 2013-01-02 LAB — URINE CULTURE

## 2013-01-02 LAB — GC/CHLAMYDIA PROBE AMP
CT Probe RNA: NEGATIVE
GC Probe RNA: NEGATIVE

## 2013-01-02 LAB — EPSTEIN-BARR VIRUS VCA ANTIBODY PANEL: EBV VCA IgG: 208 U/mL — ABNORMAL HIGH (ref <18.0)
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
## 2013-01-04 LAB — QUANTIFERON TB GOLD ASSAY (BLOOD)
Interferon Gamma Release Assay: NEGATIVE
Quantiferon Nil Value: 0.02 IU/mL
TB Ag value: 0.03 IU/mL
TB Antigen Minus Nil Value: 0.01 IU/mL

## 2013-01-04 LAB — T-HELPER CELLS (CD4) COUNT (NOT AT ARMC): CD4 T Cell Abs: 630 uL (ref 400–2700)

## 2013-01-04 LAB — HEPATITIS PANEL, ACUTE
HCV Ab: NEGATIVE
Hep A IgM: NEGATIVE
Hep B C IgM: NEGATIVE
Hepatitis B Surface Ag: NEGATIVE

## 2013-01-05 LAB — CMV IGM: CMV IgM: <8 AU/mL (ref <30.00)

## 2013-01-06 LAB — CULTURE, BLOOD (ROUTINE X 2): Culture: NO GROWTH

## 2013-01-07 ENCOUNTER — Inpatient Hospital Stay: Admitting: Internal Medicine

## 2013-01-07 LAB — CMV (CYTOMEGALOVIRUS) DNA ULTRAQUANT, PCR: CMV DNA Quant: <200 copies/mL (ref <200)

## 2013-02-23 ENCOUNTER — Encounter: Payer: Self-pay | Admitting: Infectious Disease

## 2013-06-07 DIAGNOSIS — H903 Sensorineural hearing loss, bilateral: Secondary | ICD-10-CM | POA: Insufficient documentation

## 2013-06-29 ENCOUNTER — Encounter (HOSPITAL_COMMUNITY): Payer: Self-pay | Admitting: Emergency Medicine

## 2013-06-29 ENCOUNTER — Emergency Department (HOSPITAL_COMMUNITY): Payer: No Typology Code available for payment source

## 2013-06-29 ENCOUNTER — Emergency Department (HOSPITAL_COMMUNITY)
Admission: EM | Admit: 2013-06-29 | Discharge: 2013-06-29 | Disposition: A | Discharge status: Home / Self Care | Payer: No Typology Code available for payment source | Attending: Emergency Medicine | Admitting: Emergency Medicine

## 2013-06-29 DIAGNOSIS — Z8679 Personal history of other diseases of the circulatory system: Secondary | ICD-10-CM | POA: Insufficient documentation

## 2013-06-29 DIAGNOSIS — S069X9A Unspecified intracranial injury with loss of consciousness of unspecified duration, initial encounter: Secondary | ICD-10-CM

## 2013-06-29 DIAGNOSIS — Z79899 Other long term (current) drug therapy: Secondary | ICD-10-CM | POA: Insufficient documentation

## 2013-06-29 DIAGNOSIS — Z9889 Other specified postprocedural states: Secondary | ICD-10-CM | POA: Insufficient documentation

## 2013-06-29 DIAGNOSIS — S060X9A Concussion with loss of consciousness of unspecified duration, initial encounter: Secondary | ICD-10-CM | POA: Insufficient documentation

## 2013-06-29 DIAGNOSIS — S20219A Contusion of unspecified front wall of thorax, initial encounter: Secondary | ICD-10-CM

## 2013-06-29 DIAGNOSIS — Y9389 Activity, other specified: Secondary | ICD-10-CM | POA: Insufficient documentation

## 2013-06-29 DIAGNOSIS — Y92410 Unspecified street and highway as the place of occurrence of the external cause: Secondary | ICD-10-CM | POA: Insufficient documentation

## 2013-06-29 DIAGNOSIS — S298XXA Other specified injuries of thorax, initial encounter: Secondary | ICD-10-CM | POA: Insufficient documentation

## 2013-06-29 MED ORDER — HYDROCODONE-ACETAMINOPHEN 5-325 MG PO TABS
2.0000 | ORAL_TABLET | Freq: Once | ORAL | Status: AC
  Administered 2013-06-29: 2 via ORAL
  Filled 2013-06-29: qty 2

## 2013-06-29 MED ORDER — HYDROCODONE-ACETAMINOPHEN 5-325 MG PO TABS: 2.0000 | ORAL_TABLET | Freq: Four times a day (QID) | ORAL | Status: DC | PRN
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
## 2013-07-09 ENCOUNTER — Emergency Department (HOSPITAL_COMMUNITY)
Admission: EM | Admit: 2013-07-09 | Discharge: 2013-07-09 | Disposition: A | Discharge status: Home / Self Care | Payer: No Typology Code available for payment source | Attending: Emergency Medicine | Admitting: Emergency Medicine

## 2013-07-09 ENCOUNTER — Encounter (HOSPITAL_COMMUNITY): Payer: Self-pay | Admitting: Emergency Medicine

## 2013-07-09 DIAGNOSIS — S29012A Strain of muscle and tendon of back wall of thorax, initial encounter: Secondary | ICD-10-CM

## 2013-07-09 DIAGNOSIS — Z9889 Other specified postprocedural states: Secondary | ICD-10-CM | POA: Insufficient documentation

## 2013-07-09 DIAGNOSIS — Z8679 Personal history of other diseases of the circulatory system: Secondary | ICD-10-CM | POA: Insufficient documentation

## 2013-07-09 DIAGNOSIS — S0990XA Unspecified injury of head, initial encounter: Secondary | ICD-10-CM | POA: Insufficient documentation

## 2013-07-09 DIAGNOSIS — Z79899 Other long term (current) drug therapy: Secondary | ICD-10-CM | POA: Insufficient documentation

## 2013-07-09 DIAGNOSIS — Z3202 Encounter for pregnancy test, result negative: Secondary | ICD-10-CM | POA: Insufficient documentation

## 2013-07-09 DIAGNOSIS — J3489 Other specified disorders of nose and nasal sinuses: Secondary | ICD-10-CM | POA: Insufficient documentation

## 2013-07-09 DIAGNOSIS — Y9389 Activity, other specified: Secondary | ICD-10-CM | POA: Insufficient documentation

## 2013-07-09 DIAGNOSIS — Y92410 Unspecified street and highway as the place of occurrence of the external cause: Secondary | ICD-10-CM | POA: Insufficient documentation

## 2013-07-09 DIAGNOSIS — IMO0002 Reserved for concepts with insufficient information to code with codable children: Secondary | ICD-10-CM | POA: Insufficient documentation

## 2013-07-09 LAB — URINALYSIS, ROUTINE W REFLEX MICROSCOPIC
Glucose, UA: NEGATIVE mg/dL
Hgb urine dipstick: NEGATIVE
Protein, ur: NEGATIVE mg/dL
Specific Gravity, Urine: 1.04 — ABNORMAL HIGH (ref 1.005–1.030)
Urobilinogen, UA: 1 mg/dL (ref 0.0–1.0)

## 2013-07-09 LAB — POCT PREGNANCY, URINE: Preg Test, Ur: NEGATIVE

## 2013-07-09 MED ORDER — METHOCARBAMOL 500 MG PO TABS: 500.0000 mg | ORAL_TABLET | Freq: Two times a day (BID) | ORAL | Status: DC

## 2013-07-09 NOTE — ED Provider Notes (Signed)
Medical screening examination/treatment/procedure(s) were performed by non-physician practitioner and as supervising physician I was immediately available for consultation/collaboration.  Jamaica Inthavong L Randilyn Foisy, MD 07/09/13 1630 
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
## 2013-07-09 NOTE — Progress Notes (Signed)
[facility] provided pt with a list of primary care resources and ACA information.  °
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
## 2014-07-07 ENCOUNTER — Emergency Department (HOSPITAL_COMMUNITY)

## 2014-07-07 ENCOUNTER — Emergency Department (HOSPITAL_COMMUNITY)
Admission: EM | Admit: 2014-07-07 | Discharge: 2014-07-07 | Disposition: A | Discharge status: Home / Self Care | Attending: Emergency Medicine | Admitting: Emergency Medicine

## 2014-07-07 DIAGNOSIS — Z79899 Other long term (current) drug therapy: Secondary | ICD-10-CM | POA: Insufficient documentation

## 2014-07-07 DIAGNOSIS — Z8679 Personal history of other diseases of the circulatory system: Secondary | ICD-10-CM | POA: Diagnosis not present

## 2014-07-07 DIAGNOSIS — S8002XA Contusion of left knee, initial encounter: Secondary | ICD-10-CM

## 2014-07-07 DIAGNOSIS — Y9389 Activity, other specified: Secondary | ICD-10-CM | POA: Diagnosis not present

## 2014-07-07 DIAGNOSIS — Y998 Other external cause status: Secondary | ICD-10-CM | POA: Insufficient documentation

## 2014-07-07 DIAGNOSIS — S8992XA Unspecified injury of left lower leg, initial encounter: Secondary | ICD-10-CM | POA: Diagnosis present

## 2014-07-07 DIAGNOSIS — Y92410 Unspecified street and highway as the place of occurrence of the external cause: Secondary | ICD-10-CM | POA: Diagnosis not present

## 2014-07-07 DIAGNOSIS — Z3202 Encounter for pregnancy test, result negative: Secondary | ICD-10-CM | POA: Insufficient documentation

## 2014-07-07 DIAGNOSIS — Z9621 Cochlear implant status: Secondary | ICD-10-CM | POA: Insufficient documentation

## 2014-07-07 LAB — POC URINE PREG, ED: Preg Test, Ur: NEGATIVE

## 2014-07-07 MED ORDER — HYDROCODONE-ACETAMINOPHEN 5-325 MG PO TABS
2.0000 | ORAL_TABLET | Freq: Once | ORAL | Status: AC
  Administered 2014-07-07: 2 via ORAL
  Filled 2014-07-07: qty 2

## 2014-07-07 MED ORDER — HYDROCODONE-ACETAMINOPHEN 5-325 MG PO TABS: 1.0000 | ORAL_TABLET | Freq: Four times a day (QID) | ORAL | Status: DC | PRN
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
## 2014-07-07 NOTE — ED Notes (Signed)
Dr. Loretha StaplerWofford and sign language interpreter at the bedside.
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
## 2014-07-07 NOTE — ED Notes (Signed)
Explained to the patient that urine sample is needed.

## 2014-07-07 NOTE — ED Notes (Signed)
Patient is deaf. Called translator line at [phone]. They provided 2 phone numbers for sign language interpreters. Contacted [employer] at [phone], spoke to [facility], who transferred me to [place].  To short of a notice to provide an interpreter.  Called [employer] at [phone] and left message. Called returned, reported as wrong number.

## 2014-07-07 NOTE — ED Notes (Signed)
Called x-ray to report urine preg test returned neg, and patient is ready for transport.

## 2014-07-07 NOTE — Discharge Instructions (Signed)

## 2014-07-07 NOTE — ED Notes (Signed)
Applied ace wrap to left knee and ambulated in hall over 200 feet with crutches per Dr. Loretha StaplerWofford. No difficulty using crutches.

## 2015-09-06 ENCOUNTER — Encounter: Payer: Self-pay | Admitting: Obstetrics & Gynecology

## 2015-09-06 ENCOUNTER — Other Ambulatory Visit (HOSPITAL_COMMUNITY)
Admission: RE | Admit: 2015-09-06 | Discharge: 2015-09-06 | Disposition: A | Discharge status: Home / Self Care | Payer: Medicaid Other | Source: Ambulatory Visit | Attending: Obstetrics & Gynecology | Admitting: Obstetrics & Gynecology

## 2015-09-06 ENCOUNTER — Ambulatory Visit (INDEPENDENT_AMBULATORY_CARE_PROVIDER_SITE_OTHER): Payer: Medicaid Other | Admitting: Obstetrics & Gynecology

## 2015-09-06 VITALS — BP 118/85 | HR 95 | Temp 98.2°F | Ht 64.0 in | Wt 135.5 lb

## 2015-09-06 DIAGNOSIS — Z30017 Encounter for initial prescription of implantable subdermal contraceptive: Secondary | ICD-10-CM

## 2015-09-06 DIAGNOSIS — Z01419 Encounter for gynecological examination (general) (routine) without abnormal findings: Secondary | ICD-10-CM

## 2015-09-06 DIAGNOSIS — Z3202 Encounter for pregnancy test, result negative: Secondary | ICD-10-CM | POA: Diagnosis not present

## 2015-09-06 DIAGNOSIS — Z124 Encounter for screening for malignant neoplasm of cervix: Secondary | ICD-10-CM | POA: Diagnosis not present

## 2015-09-06 DIAGNOSIS — Z309 Encounter for contraceptive management, unspecified: Secondary | ICD-10-CM

## 2015-09-06 DIAGNOSIS — Z113 Encounter for screening for infections with a predominantly sexual mode of transmission: Secondary | ICD-10-CM | POA: Diagnosis not present

## 2015-09-06 LAB — POCT PREGNANCY, URINE: PREG TEST UR: NEGATIVE

## 2015-09-06 MED ORDER — ETONOGESTREL 68 MG ~~LOC~~ IMPL
68.0000 mg | DRUG_IMPLANT | Freq: Once | SUBCUTANEOUS | Status: AC
  Administered 2015-09-06: 68 mg via SUBCUTANEOUS

## 2015-09-06 NOTE — Progress Notes (Signed)
[facility] Interpreter # [phone]

## 2015-09-06 NOTE — Addendum Note (Signed)
Addended by: Faythe Casa on: 09/06/2015 03:09 PM   Modules accepted: Orders

## 2015-09-06 NOTE — Progress Notes (Signed)
Subjective:    Michele Dixon is a 20 y.o. S G0 female who presents for an annual exam. The patient has no complaints today. She would like some contraception. She used OCPs for about a month last year but didn't remember to take them. She has basically been having unprotected IC. But she doesn't want a pregnancy now. The patient is sexually active. GYN screening history: no prior history of gyn screening tests. The patient wears seatbelts: yes. The patient participates in regular exercise: yes. Has the patient ever been transfused or tattooed?: yes. (tattoos)  The patient reports that there is not domestic violence in her life.   Menstrual History: OB History    No data available      Menarche age: 62  Patient's last menstrual period was 09/02/2015 (exact date).    The following portions of the patient's history were reviewed and updated as appropriate: allergies, current medications, past family history, past medical history, past social history, past surgical history and problem list.  Review of Systems Pertinent items are noted in HPI. She works as a Interior and spatial designer. No sex in the last month, monogamous for 3 years. She would like STI testing as boyfriend cheated. She would like a flu vaccine.   Objective:    BP 118/85 mmHg  Pulse 95  Temp(Src) 98.2 F (36.8 C) (Oral)  Ht  (1.626 m)  Wt 135 lb 8 oz (61.462 kg)  BMI 23.25 kg/m2  LMP 09/02/2015 (Exact Date)  General Appearance:    Alert, cooperative, no distress, appears stated age  Head:    Normocephalic, without obvious abnormality, atraumatic  Eyes:    PERRL, conjunctiva/corneas clear, EOM's intact, fundi    benign, both eyes  Ears:    Normal TM's and external ear canals, both ears  Nose:   Nares normal, septum midline, mucosa normal, no drainage    or sinus tenderness  Throat:   Lips, mucosa, and tongue normal; teeth and gums normal  Neck:   Supple, symmetrical, trachea midline, no adenopathy;    thyroid:  no  enlargement/tenderness/nodules; no carotid   bruit or JVD  Back:     Symmetric, no curvature, ROM normal, no CVA tenderness  Lungs:     Clear to auscultation bilaterally, respirations unlabored  Chest Wall:    No tenderness or deformity   Heart:    Regular rate and rhythm, S1 and S2 normal, no murmur, rub   or gallop  Breast Exam:    No tenderness, masses, or nipple abnormality  Abdomen:     Soft, non-tender, bowel sounds active all four quadrants,    no masses, no organomegaly  Genitalia:    Normal female without lesion, discharge or tenderness, NSSA, NT, mobile, normal adnexal exam     Extremities:   Extremities normal, atraumatic, no cyanosis or edema  Pulses:   2+ and symmetric all extremities  Skin:   Skin color, texture, turgor normal, no rashes or lesions  Lymph nodes:   Cervical, supraclavicular, and axillary nodes normal  Neurologic:   CNII-XII intact, normal strength, sensation and reflexes    throughout  .    UPT was negative. Consent was signed. Time out procedure was done. Her left arm was prepped with betadine and infiltrated with 3 cc of 1% lidocaine. After adequate anesthesia was assured, the Nexplanon device was placed according to standard of care. Her arm was hemostatic and was bandaged. She tolerated the procedure well.    Assessment:    Healthy female exam.  Contraception   Plan:     Breast self exam technique reviewed and patient encouraged to perform self-exam monthly. Thin prep Pap smear.   STI testing Nexplanon- back up method for 2 weeks

## 2015-09-07 ENCOUNTER — Other Ambulatory Visit: Payer: Medicaid Other

## 2015-09-07 LAB — GC/CHLAMYDIA PROBE AMP (~~LOC~~) NOT AT ARMC
CHLAMYDIA, DNA PROBE: NEGATIVE
Neisseria Gonorrhea: NEGATIVE

## 2015-09-08 LAB — HIV ANTIBODY (ROUTINE TESTING W REFLEX): HIV: NONREACTIVE

## 2015-09-08 LAB — RPR

## 2015-09-08 LAB — CYTOLOGY - PAP

## 2015-09-08 LAB — HEPATITIS C ANTIBODY: HCV Ab: NEGATIVE

## 2015-09-08 LAB — HEPATITIS B SURFACE ANTIGEN: HEP B S AG: NEGATIVE

## 2015-09-21 ENCOUNTER — Emergency Department (HOSPITAL_COMMUNITY)
Admission: EM | Admit: 2015-09-21 | Discharge: 2015-09-21 | Disposition: A | Discharge status: Home / Self Care | Payer: Medicaid Other | Attending: Emergency Medicine | Admitting: Emergency Medicine

## 2015-09-21 ENCOUNTER — Encounter (HOSPITAL_COMMUNITY): Payer: Self-pay | Admitting: *Deleted

## 2015-09-21 DIAGNOSIS — J329 Chronic sinusitis, unspecified: Secondary | ICD-10-CM | POA: Insufficient documentation

## 2015-09-21 DIAGNOSIS — Z9621 Cochlear implant status: Secondary | ICD-10-CM | POA: Diagnosis not present

## 2015-09-21 DIAGNOSIS — Z8679 Personal history of other diseases of the circulatory system: Secondary | ICD-10-CM | POA: Insufficient documentation

## 2015-09-21 DIAGNOSIS — R51 Headache: Secondary | ICD-10-CM | POA: Diagnosis present

## 2015-09-21 MED ORDER — AMOXICILLIN-POT CLAVULANATE 875-125 MG PO TABS: 1.0000 | ORAL_TABLET | Freq: Two times a day (BID) | ORAL | Status: DC

## 2015-09-21 NOTE — ED Provider Notes (Signed)
CSN: [id number]     Arrival date & time 09/21/15  1938 History  By signing my name below, I, [facility], attest that this documentation has been prepared under the direction and in the presence of [employer], PA-C. Electronically Signed: Randell Patient, ED Scribe. 09/21/2015. 9:30 PM.   Chief Complaint  Patient presents with  . Cough  . Facial Pain   The history is provided by the patient. A language interpreter was used.  HPI Comments: Michele Dixon is a 20 y.o. female who presents with her father and boyfriend to the Emergency Department complaining of constant, mild, unchanging nasal congestion onset 3 weeks ago. Patient communicates with sign language and father acted as Equities trader. She endorses associated facial pain only with sneezing, rhinorrhea productive of greenish yellow mucus that is intermittently streaked with red or brown that she likens to blood, and bilateral ear pain. She has taken Mucinex and Alka Seltzer Plus without relief. She denies sore throat, fever, abdominal pain, nausea, and vomiting. No other medical complaints  Past Medical History  Diagnosis Date  . Migraines   . Cochlear implant in place    Past Surgical History  Procedure Laterality Date  . Cochlear implant     Family History  Problem Relation Age of Onset  . Hypertension Mother   . Hyperlipidemia Father   . Prostate cancer Father    Social History  Substance Use Topics  . Smoking status: Never Smoker   . Smokeless tobacco: None  . Alcohol Use: Yes   OB History    No data available     Review of Systems A complete 10 system review of systems was obtained and all systems are negative except as noted in the HPI and PMH.    Allergies  Review of patient's allergies indicates no known allergies.  Home Medications   Prior to Admission medications   Medication Sig Start Date End Date Taking? Authorizing Provider  acetaminophen (TYLENOL) 500 MG tablet Take 500 mg by mouth  every 6 (six) hours as needed.    Historical Provider, MD  amoxicillin-clavulanate (AUGMENTIN) 875-125 MG tablet Take 1 tablet by mouth every 12 (twelve) hours. 09/21/15   Joycie Peek, PA-C  HYDROcodone-acetaminophen (NORCO/VICODIN) 5-325 MG per tablet Take 1-2 tablets by mouth every 6 (six) hours as needed. 07/07/14   Blake Divine, MD  Norgestimate-Ethinyl Estradiol Triphasic 0.18/0.215/0.25 MG-25 MCG tab Take by mouth. 12/02/13 12/02/14  Historical Provider, MD   BP 125/95 mmHg  Pulse 86  Temp(Src) 99 F (37.2 C) (Oral)  Resp 18  SpO2 100%  LMP 09/21/2015 Physical Exam  Constitutional: She is oriented to person, place, and time. She appears well-developed and well-nourished. No distress.  HENT:  Head: Normocephalic and atraumatic.  Right Ear: Tympanic membrane and external ear normal.  Left Ear: Tympanic membrane and external ear normal.  Right TM and external ear nml. Left TM and external ear nml. Oropharynx mildly erythematous with some postnasal drip  Eyes: Conjunctivae and EOM are normal. Right eye exhibits no discharge. Left eye exhibits no discharge.  Neck: Normal range of motion. Neck supple. No tracheal deviation present.  No lymphadenopathy. No meningismus.  Cardiovascular: Normal rate, regular rhythm and normal heart sounds.   Pulmonary/Chest: Effort normal. No respiratory distress.  Abdominal: Soft. She exhibits no distension. There is no tenderness.  Musculoskeletal: Normal range of motion. She exhibits no edema or tenderness.  Neurological: She is alert and oriented to person, place, and time.  Skin: Skin is warm and dry.  Psychiatric: She has a normal mood and affect. Her behavior is normal.  Nursing note and vitals reviewed.   ED Course  Procedures   DIAGNOSTIC STUDIES: Oxygen Saturation is 100% on RA, normal by my interpretation.    COORDINATION OF CARE: 9:00 PM Will prescribe antibiotics. Advised pt of return precautions. Discussed treatment plan with pt at  bedside and pt agreed to plan.   Labs Review Labs Reviewed - No data to display  Imaging Review No results found. I have personally reviewed and evaluated these images and lab results as part of my medical decision-making.   EKG Interpretation None      MDM  Pt symptoms consistent with URI.  Due to ongoing symptoms for the past 3 weeks, we'll treat for bacterial infection. Encourage continued symptom ask support at home. Follow-up with PCP next week for reevaluation. Discussed return precautions.  Pt is hemodynamically stable & in NAD prior to discharge. Final diagnoses:  Sinusitis, unspecified chronicity, unspecified location   Filed Vitals:   09/21/15 2038  BP: 125/95  Pulse: 86  Temp: 99 F (37.2 C)  TempSrc: Oral  Resp: 18  SpO2: 100%   Meds given in ED:  Medications - No data to display  Discharge Medication List as of 09/21/2015  9:10 PM    START taking these medications   Details  amoxicillin-clavulanate (AUGMENTIN) 875-125 MG tablet Take 1 tablet by mouth every 12 (twelve) hours., Starting 09/21/2015, Until Discontinued, Print       I personally performed the services described in this documentation, which was scribed in my presence. The recorded information has been reviewed and is accurate.    Joycie Peek, PA-C 09/21/15 2132  Doug Sou, MD 09/22/15 [phone]

## 2015-09-21 NOTE — ED Notes (Addendum)
Pt complains of cough, sore throat, nasal congestion sinus pressure for the past 3 weeks. Pt states she has seen blood in her mucus for the past 2 weeks. Pt states she also has chills and feels sweaty at night. Pt is deaf, sign language interpreter used.

## 2015-09-21 NOTE — Discharge Instructions (Signed)
Take your antibiotics as prescribed. Take all of your antibiotics, do not save a or share them follow-up with your doctor as needed. Return to ED for any new or worsening symptoms  Sinusitis, Adult Sinusitis is redness, soreness, and inflammation of the paranasal sinuses. Paranasal sinuses are air pockets within the bones of your face. They are located beneath your eyes, in the middle of your forehead, and above your eyes. In healthy paranasal sinuses, mucus is able to drain out, and air is able to circulate through them by way of your nose. However, when your paranasal sinuses are inflamed, mucus and air can become trapped. This can allow bacteria and other germs to grow and cause infection. Sinusitis can develop quickly and last only a short time (acute) or continue over a long period (chronic). Sinusitis that lasts for more than 12 weeks is considered chronic. CAUSES Causes of sinusitis include:  Allergies.  Structural abnormalities, such as displacement of the cartilage that separates your nostrils (deviated septum), which can decrease the air flow through your nose and sinuses and affect sinus drainage.  Functional abnormalities, such as when the small hairs (cilia) that line your sinuses and help remove mucus do not work properly or are not present. SIGNS AND SYMPTOMS Symptoms of acute and chronic sinusitis are the same. The primary symptoms are pain and pressure around the affected sinuses. Other symptoms include:  Upper toothache.  Earache.  Headache.  Bad breath.  Decreased sense of smell and taste.  A cough, which worsens when you are lying flat.  Fatigue.  Fever.  Thick drainage from your nose, which often is green and may contain pus (purulent).  Swelling and warmth over the affected sinuses. DIAGNOSIS Your health care provider will perform a physical exam. During your exam, your health care provider may perform any of the following to help determine if you have acute  sinusitis or chronic sinusitis:  Look in your nose for signs of abnormal growths in your nostrils (nasal polyps).  Tap over the affected sinus to check for signs of infection.  View the inside of your sinuses using an imaging device that has a light attached (endoscope). If your health care provider suspects that you have chronic sinusitis, one or more of the following tests may be recommended:  Allergy tests.  Nasal culture. A sample of mucus is taken from your nose, sent to a lab, and screened for bacteria.  Nasal cytology. A sample of mucus is taken from your nose and examined by your health care provider to determine if your sinusitis is related to an allergy. TREATMENT Most cases of acute sinusitis are related to a viral infection and will resolve on their own within 10 days. Sometimes, medicines are prescribed to help relieve symptoms of both acute and chronic sinusitis. These may include pain medicines, decongestants, nasal steroid sprays, or saline sprays. However, for sinusitis related to a bacterial infection, your health care provider will prescribe antibiotic medicines. These are medicines that will help kill the bacteria causing the infection. Rarely, sinusitis is caused by a fungal infection. In these cases, your health care provider will prescribe antifungal medicine. For some cases of chronic sinusitis, surgery is needed. Generally, these are cases in which sinusitis recurs more than 3 times per year, despite other treatments. HOME CARE INSTRUCTIONS  Drink plenty of water. Water helps thin the mucus so your sinuses can drain more easily.  Use a humidifier.  Inhale steam 3-4 times a day (for example, sit in the  bathroom with the shower running).  Apply a warm, moist washcloth to your face 3-4 times a day, or as directed by your health care provider.  Use saline nasal sprays to help moisten and clean your sinuses.  Take medicines only as directed by your health care  provider.  If you were prescribed either an antibiotic or antifungal medicine, finish it all even if you start to feel better. SEEK IMMEDIATE MEDICAL CARE IF:  You have increasing pain or severe headaches.  You have nausea, vomiting, or drowsiness.  You have swelling around your face.  You have vision problems.  You have a stiff neck.  You have difficulty breathing.   This information is not intended to replace advice given to you by your health care provider. Make sure you discuss any questions you have with your health care provider.   Document Released: 07/08/2005 Document Revised: 07/29/2014 Document Reviewed: 07/23/2011 Elsevier Interactive Patient Education 2016 Elsevier Inc.  Upper Respiratory Infection, Adult Most upper respiratory infections (URIs) are a viral infection of the air passages leading to the lungs. A URI affects the nose, throat, and upper air passages. The most common type of URI is nasopharyngitis and is typically referred to as "the common cold." URIs run their course and usually go away on their own. Most of the time, a URI does not require medical attention, but sometimes a bacterial infection in the upper airways can follow a viral infection. This is called a secondary infection. Sinus and middle ear infections are common types of secondary upper respiratory infections. Bacterial pneumonia can also complicate a URI. A URI can worsen asthma and chronic obstructive pulmonary disease (COPD). Sometimes, these complications can require emergency medical care and may be life threatening.  CAUSES Almost all URIs are caused by viruses. A virus is a type of germ and can spread from one person to another.  RISKS FACTORS You may be at risk for a URI if:   You smoke.   You have chronic heart or lung disease.  You have a weakened defense (immune) system.   You are very young or very old.   You have nasal allergies or asthma.  You work in crowded or poorly  ventilated areas.  You work in health care facilities or schools. SIGNS AND SYMPTOMS  Symptoms typically develop 2-3 days after you come in contact with a cold virus. Most viral URIs last 7-10 days. However, viral URIs from the influenza virus (flu virus) can last 14-18 days and are typically more severe. Symptoms may include:   Runny or stuffy (congested) nose.   Sneezing.   Cough.   Sore throat.   Headache.   Fatigue.   Fever.   Loss of appetite.   Pain in your forehead, behind your eyes, and over your cheekbones (sinus pain).  Muscle aches.  DIAGNOSIS  Your health care provider may diagnose a URI by:  Physical exam.  Tests to check that your symptoms are not due to another condition such as:  Strep throat.  Sinusitis.  Pneumonia.  Asthma. TREATMENT  A URI goes away on its own with time. It cannot be cured with medicines, but medicines may be prescribed or recommended to relieve symptoms. Medicines may help:  Reduce your fever.  Reduce your cough.  Relieve nasal congestion. HOME CARE INSTRUCTIONS   Take medicines only as directed by your health care provider.   Gargle warm saltwater or take cough drops to comfort your throat as directed by your health care  provider.  Use a warm mist humidifier or inhale steam from a shower to increase air moisture. This may make it easier to breathe.  Drink enough fluid to keep your urine clear or pale yellow.   Eat soups and other clear broths and maintain good nutrition.   Rest as needed.   Return to work when your temperature has returned to normal or as your health care provider advises. You may need to stay home longer to avoid infecting others. You can also use a face mask and careful hand washing to prevent spread of the virus.  Increase the usage of your inhaler if you have asthma.   Do not use any tobacco products, including cigarettes, chewing tobacco, or electronic cigarettes. If you need  help quitting, ask your health care provider. PREVENTION  The best way to protect yourself from getting a cold is to practice good hygiene.   Avoid oral or hand contact with people with cold symptoms.   Wash your hands often if contact occurs.  There is no clear evidence that vitamin C, vitamin E, echinacea, or exercise reduces the chance of developing a cold. However, it is always recommended to get plenty of rest, exercise, and practice good nutrition.  SEEK MEDICAL CARE IF:   You are getting worse rather than better.   Your symptoms are not controlled by medicine.   You have chills.  You have worsening shortness of breath.  You have brown or red mucus.  You have yellow or brown nasal discharge.  You have pain in your face, especially when you bend forward.  You have a fever.  You have swollen neck glands.  You have pain while swallowing.  You have white areas in the back of your throat. SEEK IMMEDIATE MEDICAL CARE IF:   You have severe or persistent:  Headache.  Ear pain.  Sinus pain.  Chest pain.  You have chronic lung disease and any of the following:  Wheezing.  Prolonged cough.  Coughing up blood.  A change in your usual mucus.  You have a stiff neck.  You have changes in your:  Vision.  Hearing.  Thinking.  Mood. MAKE SURE YOU:   Understand these instructions.  Will watch your condition.  Will get help right away if you are not doing well or get worse.   This information is not intended to replace advice given to you by your health care provider. Make sure you discuss any questions you have with your health care provider.   Document Released: 01/01/2001 Document Revised: 11/22/2014 Document Reviewed: 10/13/2013 Elsevier Interactive Patient Education [employer]

## 2015-09-21 NOTE — Progress Notes (Signed)
Patient listed as having Medicaid [place] acces insurance.  Pcp listed on patient's insurnace card is located at the [facility].  System updated.

## 2016-01-18 ENCOUNTER — Encounter (HOSPITAL_COMMUNITY): Payer: Self-pay | Admitting: Emergency Medicine

## 2016-01-18 ENCOUNTER — Ambulatory Visit (HOSPITAL_COMMUNITY)
Admission: EM | Admit: 2016-01-18 | Discharge: 2016-01-18 | Disposition: A | Discharge status: Home / Self Care | Payer: Medicaid Other | Attending: Emergency Medicine | Admitting: Emergency Medicine

## 2016-01-18 DIAGNOSIS — J40 Bronchitis, not specified as acute or chronic: Secondary | ICD-10-CM

## 2016-01-18 DIAGNOSIS — J329 Chronic sinusitis, unspecified: Secondary | ICD-10-CM | POA: Diagnosis not present

## 2016-01-18 MED ORDER — AMOXICILLIN 500 MG PO CAPS: 500.0000 mg | ORAL_CAPSULE | Freq: Three times a day (TID) | ORAL | Status: DC

## 2016-01-18 NOTE — ED Notes (Signed)
[facility] a [facility] cough and nasal congestion and drainage x 1 week. The patient was hearing impaired but is able to speak and read lips.

## 2016-01-18 NOTE — ED Provider Notes (Signed)
CSN: [id number]     Arrival date & time [id number]/29/17  1831 History   First MD Initiated Contact with Patient 01/18/16 1906     Chief Complaint  Patient presents with  . Cough  . Nasal Congestion   (Consider location/radiation/quality/duration/timing/severity/associated sxs/prior Treatment) HPI pt is deaf, declined, sign language, states she is happy to read lips.  History obtained from patient: Location:  Upper resp Context/Duration: Cold symptoms with nasal drainage and cough for 1 month  Severity: 2  Quality:dry cough,  Timing:       Constant, worse with laying down     Home Treatment: OTC meds Associated symptoms:  Yellow sinus drainage Family History:HTN-mother    Past Medical History  Diagnosis Date  . Migraines   . Cochlear implant in place    Past Surgical History  Procedure Laterality Date  . Cochlear implant     Family History  Problem Relation Age of Onset  . Hypertension Mother   . Hyperlipidemia Father   . Prostate cancer Father    Social History  Substance Use Topics  . Smoking status: Never Smoker   . Smokeless tobacco: None  . Alcohol Use: Yes   OB History    No data available     Review of Systems  Denies: HEADACHE, NAUSEA, ABDOMINAL PAIN, CHEST PAIN, CONGESTION, DYSURIA, SHORTNESS OF BREATH  Allergies  Review of patient's allergies indicates no known allergies.  Home Medications   Prior to Admission medications   Medication Sig Start Date End Date Taking? Authorizing Provider  HYDROcodone-acetaminophen (NORCO/VICODIN) 5-325 MG per tablet Take 1-2 tablets by mouth every 6 (six) hours as needed. 07/07/14  Yes Blake DivineJohn Wofford, MD  acetaminophen (TYLENOL) 500 MG tablet Take 500 mg by mouth every 6 (six) hours as needed.    Historical Provider, MD  amoxicillin (AMOXIL) 500 MG capsule Take 1 capsule (500 mg total) by mouth 3 (three) times daily. 01/18/16   Tharon AquasFrank C Patrick, PA  amoxicillin-clavulanate (AUGMENTIN) 875-125 MG tablet Take 1 tablet by mouth  every 12 (twelve) hours. 09/21/15   Joycie PeekBenjamin Cartner, PA-C  Norgestimate-Ethinyl Estradiol Triphasic 0.18/0.215/0.25 MG-25 MCG tab Take by mouth. 12/02/13 12/02/14  Historical Provider, MD   Meds Ordered and Administered this Visit  Medications - No data to display  BP 121/82 mmHg  Pulse 90  Temp(Src) 98.4 F (36.9 C) (Oral)  Resp 16  SpO2 100%  LMP 01/18/2016 (Exact Date) No data found.   Physical Exam NURSES NOTES AND VITAL SIGNS REVIEWED. CONSTITUTIONAL: Well developed, well nourished, no acute distress HEENT: normocephalic, atraumatic EYES: Conjunctiva normal NECK:normal ROM, supple, no adenopathy PULMONARY:No respiratory distress, normal effort ABDOMINAL: Soft, ND, NT BS+, No CVAT MUSCULOSKELETAL: Normal ROM of all extremities,  SKIN: warm and dry without rash PSYCHIATRIC: Mood and affect, behavior are normal  ED Course  Procedures (including critical care time)  Labs Review Labs Reviewed - No data to display  Imaging Review No results found.   Visual Acuity Review  Right Eye Distance:   Left Eye Distance:   Bilateral Distance:    Right Eye Near:   Left Eye Near:    Bilateral Near:        RX amoxil, 500 mg MDM   1. Sinobronchitis     Patient is reassured that there are no issues that require transfer to higher level of care at this time or additional tests. Patient is advised to continue home symptomatic treatment. Patient is advised that if there are new or worsening symptoms to attend  the emergency department, contact primary care provider, or return to UC. Instructions of care provided discharged home in stable condition.    THIS NOTE WAS GENERATED USING A VOICE RECOGNITION SOFTWARE PROGRAM. ALL REASONABLE EFFORTS  WERE MADE TO PROOFREAD THIS DOCUMENT FOR ACCURACY.  I have verbally reviewed the discharge instructions with the patient. A printed AVS was given to the patient.  All questions were answered prior to discharge.      Tharon AquasFrank C Patrick,  PA 01/18/16 1933

## 2016-01-18 NOTE — Discharge Instructions (Signed)
Sinusitis, Adult Sinusitis is redness, soreness, and puffiness (inflammation) of the air pockets in the bones of your face (sinuses). The redness, soreness, and puffiness can cause air and mucus to get trapped in your sinuses. This can allow germs to grow and cause an infection.  HOME CARE   Drink enough fluids to keep your pee (urine) clear or pale yellow.  Use a humidifier in your home.  Run a hot shower to create steam in the bathroom. Sit in the bathroom with the door closed. Breathe in the steam 3-4 times a day.  Put a warm, moist washcloth on your face 3-4 times a day, or as told by your doctor.  Use salt water sprays (saline sprays) to wet the thick fluid in your nose. This can help the sinuses drain.  Only take medicine as told by your doctor. GET HELP RIGHT AWAY IF:   Your pain gets worse.  You have very bad headaches.  You are sick to your stomach (nauseous).  You throw up (vomit).  You are very sleepy (drowsy) all the time.  Your face is puffy (swollen).  Your vision changes.  You have a stiff neck.  You have trouble breathing. MAKE SURE YOU:   Understand these instructions.  Will watch your condition.  Will get help right away if you are not doing well or get worse.   This information is not intended to replace advice given to you by your health care provider. Make sure you discuss any questions you have with your health care provider.   Document Released: 12/25/2007 Document Revised: 07/29/2014 Document Reviewed: 02/11/2012 Elsevier Interactive Patient Education 2016 Elsevier Inc. Acute Bronchitis Bronchitis is when the airways that extend from the windpipe into the lungs get red, puffy, and painful (inflamed). Bronchitis often causes thick spit (mucus) to develop. This leads to a cough. A cough is the most common symptom of bronchitis. In acute bronchitis, the condition usually begins suddenly and goes away over time (usually in 2 weeks). Smoking,  allergies, and asthma can make bronchitis worse. Repeated episodes of bronchitis may cause more lung problems. HOME CARE  Rest.  Drink enough fluids to keep your pee (urine) clear or pale yellow (unless you need to limit fluids as told by your doctor).  Only take over-the-counter or prescription medicines as told by your doctor.  Avoid smoking and secondhand smoke. These can make bronchitis worse. If you are a smoker, think about using nicotine gum or skin patches. Quitting smoking will help your lungs heal faster.  Reduce the chance of getting bronchitis again by:  Washing your hands often.  Avoiding people with cold symptoms.  Trying not to touch your hands to your mouth, nose, or eyes.  Follow up with your doctor as told. GET HELP IF: Your symptoms do not improve after 1 week of treatment. Symptoms include:  Cough.  Fever.  Coughing up thick spit.  Body aches.  Chest congestion.  Chills.  Shortness of breath.  Sore throat. GET HELP RIGHT AWAY IF:   You have an increased fever.  You have chills.  You have severe shortness of breath.  You have bloody thick spit (sputum).  You throw up (vomit) often.  You lose too much body fluid (dehydration).  You have a severe headache.  You faint. MAKE SURE YOU:   Understand these instructions.  Will watch your condition.  Will get help right away if you are not doing well or get worse.   This information is not  intended to replace advice given to you by your health care provider. Make sure you discuss any questions you have with your health care provider.   Document Released: 12/25/2007 Document Revised: 03/10/2013 Document Reviewed: 12/29/2012 Elsevier Interactive Patient Education [employer] Elsevier Inc.

## 2016-02-23 ENCOUNTER — Encounter (HOSPITAL_COMMUNITY): Payer: Self-pay

## 2016-02-23 ENCOUNTER — Ambulatory Visit (HOSPITAL_COMMUNITY)
Admission: EM | Admit: 2016-02-23 | Discharge: 2016-02-23 | Disposition: A | Discharge status: Home / Self Care | Payer: Medicaid Other | Attending: Emergency Medicine | Admitting: Emergency Medicine

## 2016-02-23 DIAGNOSIS — J029 Acute pharyngitis, unspecified: Secondary | ICD-10-CM | POA: Diagnosis not present

## 2016-02-23 DIAGNOSIS — J020 Streptococcal pharyngitis: Secondary | ICD-10-CM

## 2016-02-23 LAB — POCT RAPID STREP A: STREPTOCOCCUS, GROUP A SCREEN (DIRECT): POSITIVE — AB

## 2016-02-23 MED ORDER — NAPROXEN 500 MG PO TABS: 500.0000 mg | ORAL_TABLET | Freq: Two times a day (BID) | ORAL | 0 refills | Status: DC

## 2016-02-23 MED ORDER — AZITHROMYCIN 250 MG PO TABS: ORAL_TABLET | ORAL | 0 refills | Status: DC

## 2016-02-23 NOTE — ED Triage Notes (Signed)
Patient presents with sore throat x2 days and complains of not being able to swallow and she has cough and chills No acute distress

## 2016-02-23 NOTE — ED Provider Notes (Signed)
CSN: [id number]     Arrival date & time 02/23/16  4680 History   None    Chief Complaint  Patient presents with  . Sore Throat   (Consider location/radiation/quality/duration/timing/severity/associated sxs/prior Treatment) Used an interpreter due to she is deaf and uses sign language. Pt has had sore throat, neck pain on right side near ear for approx 4 days. Has not taken anything for pain. Notices a knot to the rt side of neck near her ear. Feels warm but has not taken a tempeture.    The history is provided by the patient (interpreter ). A language interpreter was used.  Sore Throat  This is a new problem. The current episode started more than 2 days ago. The problem occurs constantly. The problem has been rapidly worsening. Associated symptoms include headaches. Associated symptoms comments: Throat/ neck pain . Nothing aggravates the symptoms. Nothing relieves the symptoms. She has tried nothing for the symptoms. The treatment provided no relief.    Past Medical History:  Diagnosis Date  . Cochlear implant in place   . Migraines    Past Surgical History:  Procedure Laterality Date  . COCHLEAR IMPLANT     Family History  Problem Relation Age of Onset  . Hypertension Mother   . Hyperlipidemia Father   . Prostate cancer Father    Social History  Substance Use Topics  . Smoking status: Never Smoker  . Smokeless tobacco: Never Used  . Alcohol use Yes     Comment: occasional   OB History    No data available     Review of Systems  Constitutional: Positive for chills.  HENT: Positive for postnasal drip and sore throat.        Pain with swallowing, denies any sob   Eyes: Negative.   Respiratory: Negative.   Cardiovascular: Negative.   Skin: Negative.   Neurological: Positive for headaches.       Headache intermit not current    Allergies  Review of patient's allergies indicates no known allergies.  Home Medications   Prior to Admission medications   Medication  Sig Start Date End Date Taking? Authorizing Provider  acetaminophen (TYLENOL) 500 MG tablet Take 500 mg by mouth every 6 (six) hours as needed.    Historical Provider, MD  amoxicillin (AMOXIL) 500 MG capsule Take 1 capsule (500 mg total) by mouth 3 (three) times daily. 01/18/16   Tharon Aquas, PA  amoxicillin-clavulanate (AUGMENTIN) 875-125 MG tablet Take 1 tablet by mouth every 12 (twelve) hours. 09/21/15   Joycie Peek, PA-C  azithromycin (ZITHROMAX Z-PAK) 250 MG tablet Take as directed 02/23/16   Tobi Bastos, NP  HYDROcodone-acetaminophen (NORCO/VICODIN) 5-325 MG per tablet Take 1-2 tablets by mouth every 6 (six) hours as needed. 07/07/14   Blake Divine, MD  naproxen (NAPROSYN) 500 MG tablet Take 1 tablet (500 mg total) by mouth 2 (two) times daily. 02/23/16   Tobi Bastos, NP  Norgestimate-Ethinyl Estradiol Triphasic 0.18/0.215/0.25 MG-25 MCG tab Take by mouth. 12/02/13 12/02/14  Historical Provider, MD   Meds Ordered and Administered this Visit  Medications - No data to display  BP 116/79 (BP Location: Right Arm)   Pulse 100   Temp 99.4 F (37.4 C) (Oral)   Resp 12   LMP 02/20/2016 (Exact Date)   SpO2 100%  No data found.   Physical Exam  Constitutional: She is oriented to person, place, and time. She appears well-developed and well-nourished.  HENT:  Erythema to oral pharyngeal area, minimal  inflammation to nasal membranes, no drooling   Eyes: Pupils are equal, round, and reactive to light.  Neck: Normal range of motion.  Enlarged lymph node to RT side of ear.   Cardiovascular: Normal rate, regular rhythm and normal heart sounds.   Pulmonary/Chest: Effort normal and breath sounds normal.  Abdominal: Soft. Bowel sounds are normal.  Neurological: She is alert and oriented to person, place, and time.  Skin: Skin is warm and dry.    Urgent Care Course   Clinical Course    Procedures (including critical care time)  Labs Review Labs Reviewed  POCT RAPID STREP A  - Abnormal; Notable for the following:       Result Value   Streptococcus, Group A Screen (Direct) POSITIVE (*)    All other components within normal limits    Imaging Review No results found.   Visual Acuity Review  Right Eye Distance:   Left Eye Distance:   Bilateral Distance:    Right Eye Near:   Left Eye Near:    Bilateral Near:         MDM   1. Strep throat    Try salt water gargles to help . -push fluids. -take NSAID to help with pain -Strep test is positive avoid  Contact with others for 5 days. -Go to the ER if you began having drooling, sob, sever neck pain, abd pain  -Take ABX complete dose     Tobi Bastos, NP 02/24/16 1011    Tobi Bastos, NP 02/24/16 1859

## 2016-04-12 ENCOUNTER — Ambulatory Visit: Payer: Medicaid Other | Admitting: Obstetrics & Gynecology

## 2016-04-12 ENCOUNTER — Encounter: Payer: Self-pay | Admitting: Obstetrics & Gynecology

## 2016-04-12 VITALS — BP 107/70 | HR 81 | Wt 136.3 lb

## 2016-04-12 DIAGNOSIS — Z3046 Encounter for surveillance of implantable subdermal contraceptive: Secondary | ICD-10-CM

## 2016-04-12 MED ORDER — ETONOGESTREL-ETHINYL ESTRADIOL 0.12-0.015 MG/24HR VA RING: VAGINAL_RING | VAGINAL | 12 refills | Status: DC

## 2016-04-12 NOTE — Progress Notes (Signed)
   Subjective:    Patient ID: Michele Dixon, female    DOB: [birth year], 20 y.o.   MRN: [id number]  HPI  20 yo S AA G0 here for Nexplanon removal. She has bled for the last 4 months and is quite certain that she wants it removed. She would like to use Nuvaring  Review of Systems     Objective:   [facility], using an interpretor Consent was signed and time out was done. Her right arm was prepped with betadine after establishing the position of the Nexplanon. The device had migrated from its point of insertion about 3 cm up her arm and just below the edge of the bicep muscle. The area at the inferior edge of the device was infiltrated with 2 cc of 1% lidocaine. A small incision was made. I was unable to grasp the distal end of the device so I reprepped and used lidocaine at the other end of the device. I made another small incision and was still unable to grasp the device. Steristrips were placed across both incisions  and her arm was noted to be hemostatic. It was bandaged.  She tolerated the procedure well.       Assessment & Plan:  Bleeding with Nexplanon and desire for its removal Schedule removal in the OR (I won't have to use lidocaine and less my ability to feel and remove the device) She will start the Nuvaring today

## 2016-04-26 ENCOUNTER — Encounter (HOSPITAL_COMMUNITY): Payer: Self-pay | Admitting: *Deleted

## 2016-05-15 ENCOUNTER — Encounter (HOSPITAL_COMMUNITY): Payer: Self-pay | Admitting: Emergency Medicine

## 2016-05-15 ENCOUNTER — Ambulatory Visit (HOSPITAL_COMMUNITY): Payer: Medicaid Other | Admitting: Anesthesiology

## 2016-05-15 ENCOUNTER — Ambulatory Visit (HOSPITAL_COMMUNITY)
Admission: RE | Admit: 2016-05-15 | Discharge: 2016-05-15 | Disposition: A | Discharge status: Home / Self Care | Payer: Medicaid Other | Source: Ambulatory Visit | Attending: Obstetrics & Gynecology | Admitting: Obstetrics & Gynecology

## 2016-05-15 ENCOUNTER — Encounter (HOSPITAL_COMMUNITY)
Admission: RE | Disposition: A | Discharge status: Home / Self Care | Payer: Self-pay | Source: Ambulatory Visit | Attending: Obstetrics & Gynecology

## 2016-05-15 ENCOUNTER — Ambulatory Visit (HOSPITAL_COMMUNITY): Payer: Medicaid Other

## 2016-05-15 DIAGNOSIS — Z7982 Long term (current) use of aspirin: Secondary | ICD-10-CM | POA: Insufficient documentation

## 2016-05-15 DIAGNOSIS — M795 Residual foreign body in soft tissue: Secondary | ICD-10-CM

## 2016-05-15 DIAGNOSIS — Z4589 Encounter for adjustment and management of other implanted devices: Secondary | ICD-10-CM | POA: Insufficient documentation

## 2016-05-15 DIAGNOSIS — Z3046 Encounter for surveillance of implantable subdermal contraceptive: Secondary | ICD-10-CM

## 2016-05-15 HISTORY — PX: NORPLANT REMOVAL: SHX5385

## 2016-05-15 LAB — PREGNANCY, URINE: Preg Test, Ur: NEGATIVE

## 2016-05-15 SURGERY — REMOVAL, NORPLANT
Anesthesia: General | Laterality: Left

## 2016-05-15 MED ORDER — LIDOCAINE HCL 0.5 % IJ SOLN
INTRAMUSCULAR | Status: DC | PRN
  Administered 2016-05-15: 5 mL

## 2016-05-15 MED ORDER — SCOPOLAMINE 1 MG/3DAYS TD PT72
MEDICATED_PATCH | TRANSDERMAL | Status: AC
  Administered 2016-05-15: 1.5 mg via TRANSDERMAL
  Filled 2016-05-15: qty 1

## 2016-05-15 MED ORDER — LIDOCAINE HCL (CARDIAC) 20 MG/ML IV SOLN
INTRAVENOUS | Status: DC | PRN
  Administered 2016-05-15: 100 mg via INTRAVENOUS

## 2016-05-15 MED ORDER — MIDAZOLAM HCL 5 MG/5ML IJ SOLN
INTRAMUSCULAR | Status: DC | PRN
  Administered 2016-05-15: 2 mg via INTRAVENOUS

## 2016-05-15 MED ORDER — KETOROLAC TROMETHAMINE 30 MG/ML IJ SOLN
INTRAMUSCULAR | Status: DC | PRN
  Administered 2016-05-15: 30 mg via INTRAVENOUS

## 2016-05-15 MED ORDER — IBUPROFEN 600 MG PO TABS: 600.0000 mg | ORAL_TABLET | Freq: Four times a day (QID) | ORAL | 1 refills | Status: DC | PRN

## 2016-05-15 MED ORDER — ONDANSETRON HCL 4 MG/2ML IJ SOLN
INTRAMUSCULAR | Status: AC
  Filled 2016-05-15: qty 2

## 2016-05-15 MED ORDER — ONDANSETRON HCL 4 MG/2ML IJ SOLN
INTRAMUSCULAR | Status: DC | PRN
  Administered 2016-05-15: 4 mg via INTRAVENOUS

## 2016-05-15 MED ORDER — DEXAMETHASONE SODIUM PHOSPHATE 4 MG/ML IJ SOLN
INTRAMUSCULAR | Status: DC | PRN
  Administered 2016-05-15: 10 mg via INTRAVENOUS

## 2016-05-15 MED ORDER — MIDAZOLAM HCL 2 MG/2ML IJ SOLN
INTRAMUSCULAR | Status: AC
  Filled 2016-05-15: qty 2

## 2016-05-15 MED ORDER — LIDOCAINE HCL (CARDIAC) 20 MG/ML IV SOLN
INTRAVENOUS | Status: AC
  Filled 2016-05-15: qty 5

## 2016-05-15 MED ORDER — PROPOFOL 10 MG/ML IV BOLUS
INTRAVENOUS | Status: AC
  Filled 2016-05-15: qty 20

## 2016-05-15 MED ORDER — FENTANYL CITRATE (PF) 100 MCG/2ML IJ SOLN
INTRAMUSCULAR | Status: AC
  Filled 2016-05-15: qty 2

## 2016-05-15 MED ORDER — DEXAMETHASONE SODIUM PHOSPHATE 10 MG/ML IJ SOLN
INTRAMUSCULAR | Status: AC
  Filled 2016-05-15: qty 1

## 2016-05-15 MED ORDER — KETOROLAC TROMETHAMINE 30 MG/ML IJ SOLN
INTRAMUSCULAR | Status: AC
  Filled 2016-05-15: qty 1

## 2016-05-15 MED ORDER — SCOPOLAMINE 1 MG/3DAYS TD PT72
1.0000 | MEDICATED_PATCH | Freq: Once | TRANSDERMAL | Status: DC
  Administered 2016-05-15: 1.5 mg via TRANSDERMAL

## 2016-05-15 MED ORDER — CEFAZOLIN SODIUM-DEXTROSE 2-4 GM/100ML-% IV SOLN
2.0000 g | INTRAVENOUS | Status: AC
  Administered 2016-05-15: 2 g via INTRAVENOUS

## 2016-05-15 MED ORDER — LIDOCAINE HCL (PF) 0.5 % IJ SOLN
INTRAMUSCULAR | Status: AC
  Filled 2016-05-15: qty 50

## 2016-05-15 MED ORDER — FENTANYL CITRATE (PF) 100 MCG/2ML IJ SOLN
INTRAMUSCULAR | Status: DC | PRN
  Administered 2016-05-15 (×2): 50 ug via INTRAVENOUS
  Administered 2016-05-15: 25 ug via INTRAVENOUS

## 2016-05-15 MED ORDER — OXYCODONE-ACETAMINOPHEN 5-325 MG PO TABS: 1.0000 | ORAL_TABLET | Freq: Four times a day (QID) | ORAL | 0 refills | Status: DC | PRN

## 2016-05-15 MED ORDER — PROPOFOL 10 MG/ML IV BOLUS
INTRAVENOUS | Status: DC | PRN
  Administered 2016-05-15: 150 mg via INTRAVENOUS

## 2016-05-15 MED ORDER — LACTATED RINGERS IV SOLN
INTRAVENOUS | Status: DC
  Administered 2016-05-15: 15:00:00 via INTRAVENOUS
  Administered 2016-05-15: 125 mL/h via INTRAVENOUS

## 2016-05-15 SURGICAL SUPPLY — 23 items
BLADE SURG 15 STRL LF C SS BP (BLADE) ×1 IMPLANT
BLADE SURG 15 STRL SS (BLADE) ×3
CLOSURE WOUND 1/2 X4 (GAUZE/BANDAGES/DRESSINGS) ×1
CLOTH BEACON ORANGE TIMEOUT ST (SAFETY) ×3 IMPLANT
COUNTER NEEDLE 1200 MAGNETIC (NEEDLE) ×2 IMPLANT
COVER BACK TABLE 60X90IN (DRAPES) ×3 IMPLANT
COVER LIGHT HANDLE  1/PK (MISCELLANEOUS) ×2
COVER LIGHT HANDLE 1/PK (MISCELLANEOUS) IMPLANT
DECANTER SPIKE VIAL GLASS SM (MISCELLANEOUS) ×2 IMPLANT
DRSG COVADERM PLUS 2X2 (GAUZE/BANDAGES/DRESSINGS) ×1 IMPLANT
GAUZE SPONGE 4X4 16PLY XRAY LF (GAUZE/BANDAGES/DRESSINGS) ×3 IMPLANT
GLOVE BIO SURGEON STRL SZ 6.5 (GLOVE) ×2 IMPLANT
GLOVE BIO SURGEONS STRL SZ 6.5 (GLOVE) ×1
GLOVE BIOGEL PI IND STRL 7.0 (GLOVE) ×1 IMPLANT
GLOVE BIOGEL PI INDICATOR 7.0 (GLOVE) ×10
GOWN STRL REUS W/TWL LRG LVL3 (GOWN DISPOSABLE) ×6 IMPLANT
MARKER SKIN SURG 5.25 VIO NS (MISCELLANEOUS) ×2 IMPLANT
NEEDLE HYPO 22GX1.5 SAFETY (NEEDLE) ×3 IMPLANT
NS IRRIG 1000ML POUR BTL (IV SOLUTION) ×3 IMPLANT
STRIP CLOSURE SKIN 1/2X4 (GAUZE/BANDAGES/DRESSINGS) ×1 IMPLANT
SUT VICRYL 4-0 PS2 18IN ABS (SUTURE) ×3 IMPLANT
SYR CONTROL 10ML LL (SYRINGE) ×3 IMPLANT
TOWEL OR 17X24 6PK STRL BLUE (TOWEL DISPOSABLE) ×6 IMPLANT

## 2016-05-15 NOTE — Transfer of Care (Signed)
Immediate Anesthesia Transfer of Care Note  Patient: Maximino GreenlandJessica Merrow  Procedure(s) Performed: Procedure(s) with comments: Attempted REMOVAL OF Nexplanon (Left) - Left upper arm.  Patient Location: PACU  Anesthesia Type:General  Level of Consciousness: awake, alert  and oriented  Airway & Oxygen Therapy: Patient Spontanous Breathing and Patient connected to nasal cannula oxygen  Post-op Assessment: Report given to RN and Post -op Vital signs reviewed and stable  Post vital signs: Reviewed and stable  Last Vitals:  Vitals:   05/15/16 1233 05/15/16 1458  BP: 121/76   Pulse: 75 89  Resp: 16   Temp: 36.8 C 36.6 C    Last Pain:  Vitals:   05/15/16 1233  TempSrc: Oral      Patients Stated Pain Goal: 4 (05/15/16 1233)  Complications: No apparent anesthesia complications

## 2016-05-15 NOTE — Anesthesia Procedure Notes (Signed)
Procedure Name: LMA Insertion Date/Time: 05/15/2016 1:19 PM Performed by: Junious SilkGILBERT, Ensley Blas Pre-anesthesia Checklist: Patient identified, Emergency Drugs available, Suction available, Patient being monitored and Timeout performed Patient Re-evaluated:Patient Re-evaluated prior to inductionOxygen Delivery Method: Circle system utilized Preoxygenation: Pre-oxygenation with 100% oxygen Intubation Type: IV induction Ventilation: Mask ventilation without difficulty LMA: LMA inserted LMA Size: 4.0 Number of attempts: 1 Placement Confirmation: positive ETCO2,  CO2 detector and breath sounds checked- equal and bilateral Tube secured with: Tape Dental Injury: Teeth and Oropharynx as per pre-operative assessment

## 2016-05-15 NOTE — Anesthesia Preprocedure Evaluation (Addendum)
Anesthesia Evaluation  Patient identified by MRN, date of birth, ID band Patient awake    Reviewed: Allergy & Precautions, NPO status , Patient's Chart, lab work & pertinent test results  History of Anesthesia Complications Negative for: history of anesthetic complications  Airway Mallampati: II  TM Distance: >3 FB Neck ROM: Full    Dental  (+) Teeth Intact, Dental Advisory Given   Pulmonary neg pulmonary ROS,    Pulmonary exam normal        Cardiovascular negative cardio ROS Normal cardiovascular exam     Neuro/Psych  Headaches, Cochlear implant Deaf negative psych ROS   GI/Hepatic negative GI ROS, Neg liver ROS,   Endo/Other  negative endocrine ROS  Renal/GU negative Renal ROS     Musculoskeletal   Abdominal   Peds  Hematology negative hematology ROS (+)   Anesthesia Other Findings   Reproductive/Obstetrics                            Anesthesia Physical Anesthesia Plan  ASA: I  Anesthesia Plan: General   Post-op Pain Management:    Induction: Intravenous  Airway Management Planned: LMA  Additional Equipment:   Intra-op Plan:   Post-operative Plan: Extubation in OR  Informed Consent: I have reviewed the patients History and Physical, chart, labs and discussed the procedure including the risks, benefits and alternatives for the proposed anesthesia with the patient or authorized representative who has indicated his/her understanding and acceptance.   Dental advisory given  Plan Discussed with: CRNA and Anesthesiologist  Anesthesia Plan Comments:        Anesthesia Quick Evaluation

## 2016-05-15 NOTE — H&P (Signed)
Michele Dixon is an  20 yo S AA G0 here for Nexplanon removal. She has bled for the last 4 months and is quite certain that she wants it removed. She would like to use Nuvaring. I attempted to remove it in the office but it had shifted about 2 cm away from its insertion site and after infiltrating several ccs of lidocaine, I was unable to grasp it.    Patient's last menstrual period was 05/03/2016.    Past Medical History:  Diagnosis Date  . Cochlear implant in place   . Migraines     Past Surgical History:  Procedure Laterality Date  . COCHLEAR IMPLANT      Family History  Problem Relation Age of Onset  . Hypertension Mother   . Hyperlipidemia Father   . Prostate cancer Father     Social History:  reports that she has never smoked. She has never used smokeless tobacco. She reports that she drinks alcohol. She reports that she does not use drugs.  Allergies: No Known Allergies  Prescriptions Prior to Admission  Medication Sig Dispense Refill Last Dose  . aspirin-acetaminophen-caffeine (EXCEDRIN MIGRAINE) 250-250-65 MG tablet Take 1 tablet by mouth every 6 (six) hours as needed for headache.   Past Week at Unknown time  . acetaminophen (TYLENOL) 500 MG tablet Take 500 mg by mouth every 6 (six) hours as needed.   More than a month at Unknown time  . amoxicillin (AMOXIL) 500 MG capsule Take 1 capsule (500 mg total) by mouth 3 (three) times daily. (Patient not taking: Reported on 05/02/2016) 21 capsule 0 Completed Course at Unknown time  . etonogestrel-ethinyl estradiol (NUVARING) 0.12-0.015 MG/24HR vaginal ring Insert vaginally and leave in place for 3 consecutive weeks, then remove for 1 week. 1 each 12   . naproxen (NAPROSYN) 500 MG tablet Take 1 tablet (500 mg total) by mouth 2 (two) times daily. (Patient not taking: Reported on 05/02/2016) 30 tablet 0 Not Taking at Unknown time    ROS  Blood pressure 121/76, pulse 75, temperature 98.2 F (36.8 C), temperature source Oral,  resp. rate 16, height 5\' 3"  (1.6 m), weight 63.5 kg (140 lb), last menstrual period 05/03/2016, SpO2 100 %. Physical Exam  Heart- rrr Lungs- CTAB Abd- benign  No results found for this or any previous visit (from the past 24 hour(s)).  No results found.  Assessment/Plan: Desire for removal of Nexplanon.  She understands the risks of surgery, including, but not to infection, bleeding, and damage to surrounding structures, in this case, the nerves and vasculature of the arm. She wishes to proceed.    [name] 05/15/2016, 12:48 PM

## 2016-05-15 NOTE — Op Note (Signed)
05/15/2016  2:50 PM  PATIENT:  Michele Dixon  20 y.o. female  PRE-OPERATIVE DIAGNOSIS:  Removal of contraceptive capsule  POST-OPERATIVE DIAGNOSIS:  * No post-op diagnosis entered *  PROCEDURE:  Procedure(s) with comments: Attempted REMOVAL OF Nexplanon (Left) - Left upper arm.  SURGEON:  Surgeon(s) and Role:    * Latish Toutant Inda Coke Ammon Muscatello, MD - Primary    * Catalina AntiguaPeggy Constant, MD - Assisting    * Willodean Rosenthalarolyn Harraway-Smith, MD - Assisting   ANESTHESIA:   general  EBL:  Total I/O In: -  Out: 3 [Blood:3]  BLOOD ADMINISTERED:none  DRAINS: none   LOCAL MEDICATIONS USED:  LIDOCAINE   SPECIMEN:  No Specimen  DISPOSITION OF SPECIMEN:  N/A  COUNTS:  YES  TOURNIQUET:  * No tourniquets in log *  DICTATION: .Dragon Dictation  PLAN OF CARE: Discharge to home after PACU  PATIENT DISPOSITION:  PACU - hemodynamically stable.   Delay start of Pharmacological VTE agent (>24hrs) due to surgical blood loss or risk of bleeding: not applicable  The risks benefits and alternatives of surgery were explained, understood, accepted. Consents were signed. She was taken to the operating room and general anesthesia was applied without complication. Her left arm was prepped and draped in usual sterile fashion. Timeout procedure was done. Rectal her arm and felt what I thought was her Nexplanon device. I made an incision at the lower effort to previous incisions and attempted to remove the device. I was unable to grasp it. I therefore made an incision at the previous upper incision and attempted to remove the device. I was unable to grasp it from this angle as well. I called For an ultrasound with an ultrasound tech. She believes she saw it. I have been called one of my partners, Dr. Gigi GinPeggy constant. She was unable to grasp it. I then called another partner, Dr. Willodean Rosenthalarolyn Harraway-Smith. She was unable to feel the device. I therefore had an x-ray done of her arm the device is in her arm. It appears to be fairly deep. At  this point I felt that a would not be able to remove this device, even with the help of partners. I therefore went and spoke with her parents in the waiting room. I advised them that my recommendation would be to leave the device in place at this time. I told them that the device was expired in less than 3 years from now and should cause her no harm even if left in situ. I also offered referral to a general surgeon. I will discuss with the patient at her postop follow-up visit. I placed a Steri-Strip across both incisions and Dr. arm with Coban tape. She was extubated and taken to recovery room in stable condition.

## 2016-05-15 NOTE — Anesthesia Postprocedure Evaluation (Signed)
Anesthesia Post Note  Patient: Michele Dixon  Procedure(s) Performed: Procedure(s) (LRB): Attempted REMOVAL OF Nexplanon (Left)  Patient location during evaluation: PACU Anesthesia Type: General Level of consciousness: awake and alert Pain management: pain level controlled Vital Signs Assessment: post-procedure vital signs reviewed and stable Respiratory status: spontaneous breathing, nonlabored ventilation, respiratory function stable and patient connected to nasal cannula oxygen Cardiovascular status: blood pressure returned to baseline and stable Postop Assessment: no signs of nausea or vomiting Anesthetic complications: no     Last Vitals:  Vitals:   05/15/16 1500 05/15/16 1515  BP: 114/78   Pulse: 82 78  Resp: 16 15  Temp:      Last Pain:  Vitals:   05/15/16 1233  TempSrc: Oral   Pain Goal: Patients Stated Pain Goal: 4 (05/15/16 1233)               Michele Dixon

## 2016-05-15 NOTE — Discharge Instructions (Signed)
Remove dressing in 24 hours//// Post Anesthesia Home Care Instructions  Activity: Get plenty of rest for the remainder of the day. A responsible adult should stay with you for 24 hours following the procedure.  For the next 24 hours, DO NOT: -Drive a car -Advertising copywriterperate machinery -Drink alcoholic beverages -Take any medication unless instructed by your physician -Make any legal decisions or sign important papers.  Meals: Start with liquid foods such as gelatin or soup. Progress to regular foods as tolerated. Avoid greasy, spicy, heavy foods. If nausea and/or vomiting occur, drink only clear liquids until the nausea and/or vomiting subsides. Call your physician if vomiting continues.  Special Instructions/Symptoms: Your throat may feel dry or sore from the anesthesia or the breathing tube placed in your throat during surgery. If this causes discomfort, gargle with warm salt water. The discomfort should disappear within 24 hours.  If you had a scopolamine patch placed behind your ear for the management of post- operative nausea and/or vomiting:  1. The medication in the patch is effective for 72 hours, after which it should be removed.  Wrap patch in a tissue and discard in the trash. Wash hands thoroughly with soap and water. 2. You may remove the patch earlier than 72 hours if you experience unpleasant side effects which may include dry mouth, dizziness or visual disturbances. 3. Avoid touching the patch. Wash your hands with soap and water after contact with the patch.

## 2016-05-16 ENCOUNTER — Encounter (HOSPITAL_COMMUNITY): Payer: Self-pay | Admitting: Obstetrics & Gynecology

## 2016-09-06 ENCOUNTER — Ambulatory Visit (INDEPENDENT_AMBULATORY_CARE_PROVIDER_SITE_OTHER): Payer: Medicaid Other | Admitting: Obstetrics & Gynecology

## 2016-09-06 DIAGNOSIS — Z3046 Encounter for surveillance of implantable subdermal contraceptive: Secondary | ICD-10-CM

## 2016-09-06 NOTE — Progress Notes (Signed)
Pt in to be referred to general surgery for nexplanon removal. Referral made to [facility], they will contact patient with appointment information.

## 2016-09-18 ENCOUNTER — Ambulatory Visit: Payer: Self-pay | Admitting: General Surgery

## 2016-10-17 ENCOUNTER — Encounter (HOSPITAL_BASED_OUTPATIENT_CLINIC_OR_DEPARTMENT_OTHER): Payer: Self-pay | Admitting: *Deleted

## 2016-10-21 ENCOUNTER — Encounter (HOSPITAL_BASED_OUTPATIENT_CLINIC_OR_DEPARTMENT_OTHER): Payer: Self-pay | Admitting: *Deleted

## 2016-10-21 NOTE — Progress Notes (Signed)
SPOKE W/ PT'S FATHER.  PT IS DEAF.  NPO AFTER MN.  ARRIVE AT 0900.  NEEDS HG AND URINE PREG.  REQUESTED [employer] SIGN LANGUAGE INTERPRETER DOS TO ARRIVE AT 0845.  WILL PLACE CONFIRMATION ON CHART.

## 2016-10-25 ENCOUNTER — Encounter (HOSPITAL_COMMUNITY): Payer: Self-pay

## 2016-10-25 ENCOUNTER — Emergency Department (HOSPITAL_COMMUNITY)
Admission: EM | Admit: 2016-10-25 | Discharge: 2016-10-25 | Disposition: A | Discharge status: Home / Self Care | Payer: Medicaid Other | Attending: Emergency Medicine | Admitting: Emergency Medicine

## 2016-10-25 DIAGNOSIS — L299 Pruritus, unspecified: Secondary | ICD-10-CM | POA: Diagnosis present

## 2016-10-25 DIAGNOSIS — L301 Dyshidrosis [pompholyx]: Secondary | ICD-10-CM | POA: Insufficient documentation

## 2016-10-25 MED ORDER — TRIAMCINOLONE ACETONIDE 0.1 % EX CREA: 1.0000 "application " | TOPICAL_CREAM | Freq: Two times a day (BID) | CUTANEOUS | 0 refills | Status: DC

## 2016-10-25 NOTE — ED Triage Notes (Signed)
Pt complaining of blisters on bilateral hands x 1 week. 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 Pt states some drainage x 1 week. No bleeding noted at triage.

## 2016-10-25 NOTE — ED Provider Notes (Signed)
[facility]-EMERGENCY DEPT Provider Note   CSN: [id number] Arrival date & time: 10/25/16  2209  By signing my name below, I, Cynda Acres, attest that this documentation has been prepared under the direction and in the presence of Kerrie Buffalo, NP. Electronically Signed: Cynda Acres, Scribe. 10/25/16. 11:05 PM.  History   Chief Complaint Chief Complaint  Patient presents with  . Hand Pain   HPI Comments: Michele Dixon is a 21 y.o. female who is deaf. She is here with a friend that is translating using sign language. Patient has a history of eczema, she presents to the Emergency Department complaining of sudden-onset, constant bilateral hand "blisters" that appeared one week ago. 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 Patient states she woke up with gradually worsening blisters on her bilateral hands. Patient reports touching a lot of peoples heads and does not wear gloves when washing their hair. Patient has a history of eczema, usually appears on her back and sides. Patient reports associated itching, and burning. No modifying factors indicated. Patient states warm/cold water makes her "blisters" feel worse. Patient denies any new product use, nausea, vomiting, fever, chills, or new glove use.   The history is provided by the patient. No language interpreter was used.    Past Medical History:  Diagnosis Date  . Cochlear implant in place    age 10  . Migraines   . Sensorineural hearing loss (SNHL) of both ears    can read lips but unsure about medical terms    Patient Active Problem List   Diagnosis Date Noted  . Exudative pharyngitis 01/01/2013  . Thrush 01/01/2013  . FUO (fever of unknown origin) 01/01/2013  . Syncope and collapse 01/01/2013  . 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 Otalgia 01/01/2013  . Headache(784.0) 01/01/2013  . Fever and chills 12/31/2012  . Leukocytosis 12/31/2012  . Hyponatremia 12/31/2012  . Acute low back pain 12/31/2012  . Tachycardia 12/31/2012     Past Surgical History:  Procedure Laterality Date  . COCHLEAR IMPLANT Right age 89  . NORPLANT REMOVAL Left 05/15/2016   Procedure: Attempted REMOVAL OF Nexplanon;  Surgeon: Allie Bossier, MD;  Location: [facility];  Service: Gynecology;  Laterality: Left;  Left upper arm.    OB History    No data available       Home Medications    Prior to Admission medications   Medication Sig Start Date End Date Taking? Authorizing Provider  acetaminophen (TYLENOL) 500 MG tablet Take 500 mg by mouth every 6 (six) hours as needed.    Historical Provider, MD  Ibuprofen (ADVIL) 200 MG CAPS Take by mouth.    Historical Provider, MD  triamcinolone cream (KENALOG) 0.1 % Apply 1 application topically 2 (two) times daily. 10/25/16   Spruha Weight Orlene Och, NP    Family History Family History  Problem Relation Age of Onset  . Hypertension Mother   . Hyperlipidemia Father   . Prostate cancer Father     Social History Social History  Substance Use Topics  . Smoking status: Never Smoker  . Smokeless tobacco: Never Used  . Alcohol use Yes     Comment: occasional     Allergies   Patient has no known allergies.   Review of Systems Review of Systems  Constitutional: Negative for chills and fever.  HENT: Negative for trouble swallowing.   Respiratory: Negative for shortness of breath.   Gastrointestinal: Negative for nausea and vomiting.  Musculoskeletal: Positive for arthralgias (bilateral hands).  Skin: Positive  for rash.       Blisters to the bilateral hands.      Physical Exam Updated Vital Signs BP (!) 113/94 (BP Location: Right Arm)   Pulse (!) 107   Temp 98.3 F (36.8 C) (Oral)   Resp (!) 22   LMP 10/25/2016 (Exact Date)   SpO2 99%   Physical Exam  Constitutional: She is oriented to person, place, and time. She appears well-developed and well-nourished. No distress.  Eyes: EOM are normal.  Neck: Neck supple.  Cardiovascular: Tachycardia present.   Pulmonary/Chest: Effort normal.   Abdominal: Soft. There is no tenderness.  Musculoskeletal: Normal range of motion.  See skin exam  Neurological: She is alert and oriented to person, place, and time. No cranial nerve deficit.  Skin: Skin is warm and dry.  Dry patchy cracked areas to the palms of the hands. Tiny blister areas to the palms. No red streaking, no signs of infection.   Psychiatric: She has a normal mood and affect. Her behavior is normal.  Nursing note and vitals reviewed.    ED Treatments / Results  DIAGNOSTIC STUDIES: Oxygen Saturation is 99% on RA, normal by my interpretation.    COORDINATION OF CARE: 11:03 PM Discussed treatment plan with pt at bedside and pt agreed to plan, which includes triamcinolone cream.   Labs (all labs ordered are listed, but only abnormal results are displayed) Labs Reviewed - No data to display  EKG  EKG Interpretation None       Radiology No results found.  Procedures Procedures (including critical care time)  Medications Ordered in ED Medications - No data to display   Initial Impression / Assessment and Plan / ED Course  I have reviewed the triage vital signs and the nursing notes.  Final Clinical Impressions(s) / ED Diagnoses  21 y.o. female with itching, burning and tiny blisters bilateral palms stable for d/c without signs of infection. Will treat for eczema. Patient to f/u with her PCP. She will return here as needed.  Final diagnoses:  Vesicular eczema of hands and feet    New Prescriptions New Prescriptions   TRIAMCINOLONE CREAM (KENALOG) 0.1 %    Apply 1 application topically 2 (two) times daily.   I personally performed the services described in this documentation, which was scribed in my presence. The recorded information has been reviewed and is accurate.    [address] [place], [place] 10/25/16 2321    Bethann Berkshire, MD 10/26/16 [phone]

## 2016-10-25 NOTE — ED Notes (Signed)
Pt verbalized understanding discharge instructions and denies any further needs or questions at this time. VS stable, ambulatory and steady gait.   

## 2016-10-29 ENCOUNTER — Encounter (HOSPITAL_BASED_OUTPATIENT_CLINIC_OR_DEPARTMENT_OTHER)
Admission: RE | Disposition: A | Discharge status: Home / Self Care | Payer: Self-pay | Source: Ambulatory Visit | Attending: General Surgery

## 2016-10-29 ENCOUNTER — Encounter (HOSPITAL_BASED_OUTPATIENT_CLINIC_OR_DEPARTMENT_OTHER): Payer: Self-pay | Admitting: Certified Registered"

## 2016-10-29 ENCOUNTER — Ambulatory Visit (HOSPITAL_BASED_OUTPATIENT_CLINIC_OR_DEPARTMENT_OTHER)
Admission: RE | Admit: 2016-10-29 | Discharge: 2016-10-29 | Disposition: A | Discharge status: Home / Self Care | Payer: Medicaid Other | Source: Ambulatory Visit | Attending: General Surgery | Admitting: General Surgery

## 2016-10-29 ENCOUNTER — Ambulatory Visit (HOSPITAL_BASED_OUTPATIENT_CLINIC_OR_DEPARTMENT_OTHER): Payer: Medicaid Other | Admitting: Anesthesiology

## 2016-10-29 DIAGNOSIS — Z30432 Encounter for removal of intrauterine contraceptive device: Secondary | ICD-10-CM | POA: Insufficient documentation

## 2016-10-29 DIAGNOSIS — Z791 Long term (current) use of non-steroidal anti-inflammatories (NSAID): Secondary | ICD-10-CM | POA: Insufficient documentation

## 2016-10-29 HISTORY — DX: Sensorineural hearing loss, bilateral: H90.3

## 2016-10-29 HISTORY — PX: FOREIGN BODY REMOVAL: SHX962

## 2016-10-29 LAB — POCT PREGNANCY, URINE: Preg Test, Ur: NEGATIVE

## 2016-10-29 SURGERY — FOREIGN BODY REMOVAL ADULT
Anesthesia: General | Laterality: Left

## 2016-10-29 MED ORDER — ACETAMINOPHEN 500 MG PO TABS
1000.0000 mg | ORAL_TABLET | ORAL | Status: AC
  Administered 2016-10-29: 1000 mg via ORAL
  Filled 2016-10-29: qty 2

## 2016-10-29 MED ORDER — ONDANSETRON HCL 4 MG/2ML IJ SOLN
INTRAMUSCULAR | Status: DC | PRN
  Administered 2016-10-29: 4 mg via INTRAVENOUS

## 2016-10-29 MED ORDER — DEXAMETHASONE SODIUM PHOSPHATE 4 MG/ML IJ SOLN
INTRAMUSCULAR | Status: DC | PRN
  Administered 2016-10-29: 10 mg via INTRAVENOUS

## 2016-10-29 MED ORDER — DEXAMETHASONE SODIUM PHOSPHATE 10 MG/ML IJ SOLN
INTRAMUSCULAR | Status: AC
  Filled 2016-10-29: qty 1

## 2016-10-29 MED ORDER — FENTANYL CITRATE (PF) 100 MCG/2ML IJ SOLN
INTRAMUSCULAR | Status: AC
  Filled 2016-10-29: qty 2

## 2016-10-29 MED ORDER — CELECOXIB 200 MG PO CAPS
ORAL_CAPSULE | ORAL | Status: AC
  Filled 2016-10-29: qty 2

## 2016-10-29 MED ORDER — PROPOFOL 10 MG/ML IV BOLUS
INTRAVENOUS | Status: AC
  Filled 2016-10-29: qty 20

## 2016-10-29 MED ORDER — SODIUM BICARBONATE 4 % IV SOLN
INTRAVENOUS | Status: AC
  Filled 2016-10-29: qty 5

## 2016-10-29 MED ORDER — KETOROLAC TROMETHAMINE 30 MG/ML IJ SOLN
30.0000 mg | Freq: Once | INTRAMUSCULAR | Status: DC | PRN
  Filled 2016-10-29: qty 1

## 2016-10-29 MED ORDER — LIDOCAINE 2% (20 MG/ML) 5 ML SYRINGE
INTRAMUSCULAR | Status: DC | PRN
Start: 2016-10-29 — End: 2016-10-29
  Administered 2016-10-29: 60 mg via INTRAVENOUS

## 2016-10-29 MED ORDER — CEFAZOLIN SODIUM-DEXTROSE 2-4 GM/100ML-% IV SOLN
INTRAVENOUS | Status: AC
  Filled 2016-10-29: qty 100

## 2016-10-29 MED ORDER — PROPOFOL 10 MG/ML IV BOLUS
INTRAVENOUS | Status: DC | PRN
  Administered 2016-10-29: 200 mg via INTRAVENOUS

## 2016-10-29 MED ORDER — LIDOCAINE HCL 1 % IJ SOLN
INTRAMUSCULAR | Status: AC
  Filled 2016-10-29: qty 20

## 2016-10-29 MED ORDER — PROMETHAZINE HCL 25 MG/ML IJ SOLN
6.2500 mg | INTRAMUSCULAR | Status: DC | PRN
  Filled 2016-10-29: qty 1

## 2016-10-29 MED ORDER — BACITRACIN-NEOMYCIN-POLYMYXIN 400-5-5000 EX OINT
TOPICAL_OINTMENT | CUTANEOUS | Status: AC
  Filled 2016-10-29: qty 1

## 2016-10-29 MED ORDER — FENTANYL CITRATE (PF) 100 MCG/2ML IJ SOLN
25.0000 ug | INTRAMUSCULAR | Status: DC | PRN
  Filled 2016-10-29: qty 1

## 2016-10-29 MED ORDER — BUPIVACAINE-EPINEPHRINE 0.5% -1:200000 IJ SOLN
INTRAMUSCULAR | Status: DC | PRN
  Administered 2016-10-29: 19 mL

## 2016-10-29 MED ORDER — GABAPENTIN 300 MG PO CAPS
300.0000 mg | ORAL_CAPSULE | ORAL | Status: AC
  Administered 2016-10-29: 300 mg via ORAL
  Filled 2016-10-29: qty 1

## 2016-10-29 MED ORDER — LIDOCAINE 2% (20 MG/ML) 5 ML SYRINGE
INTRAMUSCULAR | Status: AC
  Filled 2016-10-29: qty 5

## 2016-10-29 MED ORDER — CEFAZOLIN SODIUM-DEXTROSE 2-4 GM/100ML-% IV SOLN
2.0000 g | INTRAVENOUS | Status: AC
  Administered 2016-10-29: 2 g via INTRAVENOUS
  Filled 2016-10-29: qty 100

## 2016-10-29 MED ORDER — FENTANYL CITRATE (PF) 100 MCG/2ML IJ SOLN
INTRAMUSCULAR | Status: DC | PRN
  Administered 2016-10-29: 50 ug via INTRAVENOUS

## 2016-10-29 MED ORDER — CELECOXIB 400 MG PO CAPS
400.0000 mg | ORAL_CAPSULE | ORAL | Status: AC
  Administered 2016-10-29: 400 mg via ORAL
  Filled 2016-10-29: qty 1

## 2016-10-29 MED ORDER — MIDAZOLAM HCL 5 MG/5ML IJ SOLN
INTRAMUSCULAR | Status: DC | PRN
  Administered 2016-10-29: 2 mg via INTRAVENOUS

## 2016-10-29 MED ORDER — HYDROCODONE-ACETAMINOPHEN 5-325 MG PO TABS: 1.0000 | ORAL_TABLET | Freq: Four times a day (QID) | ORAL | 0 refills | Status: DC | PRN

## 2016-10-29 MED ORDER — ACETAMINOPHEN 500 MG PO TABS
ORAL_TABLET | ORAL | Status: AC
  Filled 2016-10-29: qty 2

## 2016-10-29 MED ORDER — IBUPROFEN 800 MG PO TABS: 800.0000 mg | ORAL_TABLET | Freq: Three times a day (TID) | ORAL | 0 refills | Status: DC | PRN

## 2016-10-29 MED ORDER — LACTATED RINGERS IV SOLN
INTRAVENOUS | Status: DC
  Administered 2016-10-29: 10:00:00 via INTRAVENOUS
  Filled 2016-10-29: qty 1000

## 2016-10-29 MED ORDER — CHLORHEXIDINE GLUCONATE CLOTH 2 % EX PADS
6.0000 | MEDICATED_PAD | Freq: Once | CUTANEOUS | Status: DC
  Filled 2016-10-29: qty 6

## 2016-10-29 MED ORDER — MIDAZOLAM HCL 2 MG/2ML IJ SOLN
INTRAMUSCULAR | Status: AC
  Filled 2016-10-29: qty 2

## 2016-10-29 MED ORDER — GABAPENTIN 300 MG PO CAPS
ORAL_CAPSULE | ORAL | Status: AC
  Filled 2016-10-29: qty 1

## 2016-10-29 MED ORDER — BUPIVACAINE-EPINEPHRINE (PF) 0.5% -1:200000 IJ SOLN
INTRAMUSCULAR | Status: AC
  Filled 2016-10-29: qty 30

## 2016-10-29 MED ORDER — ONDANSETRON HCL 4 MG/2ML IJ SOLN
INTRAMUSCULAR | Status: AC
  Filled 2016-10-29: qty 2

## 2016-10-29 SURGICAL SUPPLY — 50 items
BLADE CLIPPER SURG (BLADE) IMPLANT
BLADE HEX COATED 2.75 (ELECTRODE) ×3 IMPLANT
BLADE SURG 15 STRL LF DISP TIS (BLADE) ×1 IMPLANT
BLADE SURG 15 STRL SS (BLADE) ×3
BNDG GAUZE ELAST 4 BULKY (GAUZE/BANDAGES/DRESSINGS) IMPLANT
CANISTER SUCT 1200ML W/VALVE (MISCELLANEOUS) IMPLANT
CHLORAPREP W/TINT 26ML (MISCELLANEOUS) ×3 IMPLANT
CLOSURE WOUND 1/4X4 (GAUZE/BANDAGES/DRESSINGS) ×1
COVER BACK TABLE 60X90IN (DRAPES) ×3 IMPLANT
COVER MAYO STAND STRL (DRAPES) IMPLANT
DECANTER SPIKE VIAL GLASS SM (MISCELLANEOUS) IMPLANT
DRAIN PENROSE 18X1/2 LTX STRL (DRAIN) IMPLANT
DRAIN PENROSE 18X1/4 LTX STRL (WOUND CARE) IMPLANT
DRAPE LAPAROTOMY 100X72 PEDS (DRAPES) ×3 IMPLANT
DRAPE UTILITY XL STRL (DRAPES) ×3 IMPLANT
DRSG TEGADERM 4X4.75 (GAUZE/BANDAGES/DRESSINGS) IMPLANT
ELECT REM PT RETURN 9FT ADLT (ELECTROSURGICAL) ×3
ELECTRODE REM PT RTRN 9FT ADLT (ELECTROSURGICAL) ×1 IMPLANT
GLOVE BIOGEL PI IND STRL 7.0 (GLOVE) ×1 IMPLANT
GLOVE BIOGEL PI INDICATOR 7.0 (GLOVE) ×2
GLOVE SURG SS PI 7.0 STRL IVOR (GLOVE) ×3 IMPLANT
GOWN STRL REUS W/ TWL LRG LVL3 (GOWN DISPOSABLE) ×1 IMPLANT
GOWN STRL REUS W/TWL LRG LVL3 (GOWN DISPOSABLE) ×3
KIT RM TURNOVER CYSTO AR (KITS) ×3 IMPLANT
NDL HYPO 25X1 1.5 SAFETY (NEEDLE) ×1 IMPLANT
NEEDLE HYPO 25X1 1.5 SAFETY (NEEDLE) ×3 IMPLANT
NS IRRIG 500ML POUR BTL (IV SOLUTION) IMPLANT
PACK BASIN DAY SURGERY FS (CUSTOM PROCEDURE TRAY) ×3 IMPLANT
PENCIL BUTTON HOLSTER BLD 10FT (ELECTRODE) ×3 IMPLANT
SLEEVE SCD COMPRESS KNEE MED (MISCELLANEOUS) IMPLANT
SPONGE GAUZE 4X4 12PLY STER LF (GAUZE/BANDAGES/DRESSINGS) IMPLANT
SPONGE LAP 4X18 X RAY DECT (DISPOSABLE) IMPLANT
STRIP CLOSURE SKIN 1/4X4 (GAUZE/BANDAGES/DRESSINGS) ×1 IMPLANT
SUCTION FRAZIER TIP 10 FR DISP (SUCTIONS) IMPLANT
SUT MNCRL AB 4-0 PS2 18 (SUTURE) IMPLANT
SUT SILK 3 0 TIES 17X18 (SUTURE)
SUT SILK 3-0 18XBRD TIE BLK (SUTURE) IMPLANT
SUT VIC AB 2-0 SH 27 (SUTURE)
SUT VIC AB 2-0 SH 27XBRD (SUTURE) IMPLANT
SUT VIC AB 3-0 SH 27 (SUTURE)
SUT VIC AB 3-0 SH 27X BRD (SUTURE) IMPLANT
SWAB CULTURE ESWAB REG 1ML (MISCELLANEOUS) IMPLANT
SWAB CULTURE LIQ STUART DBL (MISCELLANEOUS) IMPLANT
SYR BULB 3OZ (MISCELLANEOUS) IMPLANT
SYR CONTROL 10ML LL (SYRINGE) ×3 IMPLANT
TOWEL OR 17X24 6PK STRL BLUE (TOWEL DISPOSABLE) ×3 IMPLANT
TUBE ANAEROBIC SPECIMEN COL (MISCELLANEOUS) IMPLANT
TUBE CONNECTING 12'X1/4 (SUCTIONS)
TUBE CONNECTING 12X1/4 (SUCTIONS) IMPLANT
YANKAUER SUCT BULB TIP NO VENT (SUCTIONS) IMPLANT

## 2016-10-29 NOTE — Op Note (Signed)
Preoperative diagnosis: left arm foreign body  Postoperative diagnosis: same   Procedure: excision of subfascial left arm foreign body with complex repair  Surgeon: Feliciana Rossetti, M.D.  Asst: none  Anesthesia: general  Indications for procedure: Michele Dixon is a 21 y.o. year old female with retained contraceptive device in left upper arm that has had multiple previous attempts for removal presents for ultrasound guided removal today.Marland Kitchen  Description of procedure: The patient was brought into the operative suite. Anesthesia was administered with General LMA anesthesia. WHO checklist was applied. The patient was then placed in supine position. The area was prepped and draped in the usual sterile fashion.  Next, ultrasound was used to identified the dense linear object in the medial aspect of the arm. It appeared to be more medial and lower than previous incision. The previous scar was reopened and extended medially 4mm. Next, blunt dissection was used to identify the fascia of the bicep. Palpation of this area identified the rod-like structure. Therefore, a small incision was made in the fascia of the bicep and blunt dissection of the a few muscle fibers allowed the dissection of the foreign body free of surrounding tissue. The device was then removed in its entirety and sent to pathology. The fascia was closed with interrupted 3-0 vicryl. The deep dermal area was closed with 3-0 vicryl and the skin was closed in running 4-0 monocryl. Steristrips and bandaids were put in place for dressing. The patient awoke from anesthesia and was brought to pacu in stable condition  Findings: contraceptive device just deep to the fascia, intact  Specimen: rod-like foreign body  Implant: none   Blood loss: <38ml  Local anesthesia: 19ml 0.5% marcaine w epinephrine  Complications: none  Feliciana Rossetti, M.D. General, Bariatric, & Minimally Invasive Surgery [facility]

## 2016-10-29 NOTE — H&P (Signed)
Michele Dixon is an 21 y.o. female.   Chief Complaint: foreign body issues HPI: 21 yo female with foreign body in left arm placed for contraception and has had difficulty with removal. She has undergone previous in office and in OR attempts to excise without success. On ultrasound the device appears to be within the bicep muscle.  Past Medical History:  Diagnosis Date  . Cochlear implant in place    age 10  . Migraines   . Sensorineural hearing loss (SNHL) of both ears    can read lips but unsure about medical terms    Past Surgical History:  Procedure Laterality Date  . COCHLEAR IMPLANT Right age 44  . NORPLANT REMOVAL Left 05/15/2016   Procedure: Attempted REMOVAL OF Nexplanon;  Surgeon: Michele Bossier, MD;  Location: [facility];  Service: Gynecology;  Laterality: Left;  Left upper arm.    Family History  Problem Relation Age of Onset  . Hypertension Mother   . Hyperlipidemia Father   . Prostate cancer Father    Social History:  reports that she has never smoked. She has never used smokeless tobacco. She reports that she drinks alcohol. She reports that she does not use drugs.  Allergies: No Known Allergies  Medications Prior to Admission  Medication Sig Dispense Refill  . acetaminophen (TYLENOL) 500 MG tablet Take 500 mg by mouth every 6 (six) hours as needed.    . Ibuprofen (ADVIL) 200 MG CAPS Take by mouth.    . triamcinolone cream (KENALOG) 0.1 % Apply 1 application topically 2 (two) times daily. 30 g 0    No results found for this or any previous visit (from the past 48 hour(s)). No results found.  Review of Systems  Constitutional: Negative for chills and fever.  HENT: Negative for hearing loss.   Eyes: Negative for blurred vision and double vision.  Respiratory: Negative for cough and hemoptysis.   Cardiovascular: Negative for chest pain and palpitations.  Gastrointestinal: Negative for abdominal pain, nausea and vomiting.  Genitourinary: Negative for dysuria and  urgency.  Musculoskeletal: Negative for myalgias and neck pain.  Skin: Negative for itching and rash.  Neurological: Negative for dizziness, tingling and headaches.  Endo/Heme/Allergies: Does not bruise/bleed easily.  Psychiatric/Behavioral: Negative for depression and suicidal ideas.    Blood pressure 115/79, pulse 91, temperature 99.2 F (37.3 C), temperature source Oral, resp. rate 20, height  (1.6 m), weight 62.1 kg (137 lb), SpO2 99 %. Physical Exam  Vitals reviewed. Constitutional: She is oriented to person, place, and time. She appears well-developed and well-nourished.  HENT:  Head: Normocephalic and atraumatic.  Eyes: Conjunctivae and EOM are normal. Pupils are equal, round, and reactive to light.  Neck: Normal range of motion. Neck supple.  Cardiovascular: Normal rate and regular rhythm.   Respiratory: Effort normal and breath sounds normal.  GI: Soft. Bowel sounds are normal. She exhibits no distension. There is no tenderness.  Musculoskeletal: Normal range of motion.  Neurological: She is alert and oriented to person, place, and time.  Skin: Skin is warm and dry.  Psychiatric: She has a normal mood and affect. Her behavior is normal.     Assessment/Plan 21 yo female with retained contraceptive device in left arm -excision of left arm with ultrasound guidance  Michele Pickle, MD 10/29/2016, 9:53 AM

## 2016-10-29 NOTE — Discharge Instructions (Signed)

## 2016-10-29 NOTE — Anesthesia Procedure Notes (Signed)
Procedure Name: LMA Insertion Date/Time: 10/29/2016 10:46 AM Performed by: Renella Cunas D Pre-anesthesia Checklist: Patient identified, Emergency Drugs available, Suction available and Patient being monitored Patient Re-evaluated:Patient Re-evaluated prior to inductionOxygen Delivery Method: Circle system utilized Preoxygenation: Pre-oxygenation with 100% oxygen Intubation Type: IV induction Ventilation: Mask ventilation without difficulty LMA: LMA inserted LMA Size: 4.0 Number of attempts: 1 Airway Equipment and Method: Bite block Placement Confirmation: positive ETCO2 Tube secured with: Tape Dental Injury: Teeth and Oropharynx as per pre-operative assessment

## 2016-10-29 NOTE — Anesthesia Preprocedure Evaluation (Signed)
Anesthesia Evaluation  Patient identified by MRN, date of birth, ID band Patient awake    Reviewed: Allergy & Precautions, NPO status , Patient's Chart, lab work & pertinent test results  Airway Mallampati: II  TM Distance: >3 FB Neck ROM: Full    Dental no notable dental hx.    Pulmonary neg pulmonary ROS,    Pulmonary exam normal breath sounds clear to auscultation       Cardiovascular negative cardio ROS Normal cardiovascular exam Rhythm:Regular Rate:Normal     Neuro/Psych negative neurological ROS  negative psych ROS   GI/Hepatic negative GI ROS, Neg liver ROS,   Endo/Other  negative endocrine ROS  Renal/GU negative Renal ROS  negative genitourinary   Musculoskeletal negative musculoskeletal ROS (+)   Abdominal   Peds negative pediatric ROS (+)  Hematology negative hematology ROS (+)   Anesthesia Other Findings   Reproductive/Obstetrics negative OB ROS                             Anesthesia Physical Anesthesia Plan  ASA: II  Anesthesia Plan: General   Post-op Pain Management:    Induction: Intravenous  Airway Management Planned: LMA  Additional Equipment:   Intra-op Plan:   Post-operative Plan:   Informed Consent: I have reviewed the patients History and Physical, chart, labs and discussed the procedure including the risks, benefits and alternatives for the proposed anesthesia with the patient or authorized representative who has indicated his/her understanding and acceptance.   Dental advisory given  Plan Discussed with: CRNA and Surgeon  Anesthesia Plan Comments:         Anesthesia Quick Evaluation  

## 2016-10-29 NOTE — Transfer of Care (Signed)
Immediate Anesthesia Transfer of Care Note  Patient: Michele Dixon  Procedure(s) Performed: Procedure(s) (LRB): EXCISION FOREIGN BODY LEFT ARM (Left)  Patient Location: PACU  Anesthesia Type: General  Level of Consciousness: awake, oriented, sedated and patient cooperative  Airway & Oxygen Therapy: Patient Spontanous Breathing and Patient connected to face mask oxygen  Post-op Assessment: Report given to PACU RN and Post -op Vital signs reviewed and stable  Post vital signs: Reviewed and stable  Complications: No apparent anesthesia complications

## 2016-10-29 NOTE — Anesthesia Postprocedure Evaluation (Addendum)
Anesthesia Post Note  Patient: Michele Dixon  Procedure(s) Performed: Procedure(s) (LRB): EXCISION FOREIGN BODY LEFT ARM (Left)  Patient location during evaluation: PACU Anesthesia Type: General Level of consciousness: awake and alert Pain management: pain level controlled Vital Signs Assessment: post-procedure vital signs reviewed and stable Respiratory status: spontaneous breathing, nonlabored ventilation, respiratory function stable and patient connected to nasal cannula oxygen Cardiovascular status: blood pressure returned to baseline and stable Postop Assessment: no signs of nausea or vomiting Anesthetic complications: no       Last Vitals:  Vitals:   10/29/16 1130 10/29/16 1145  BP: (!) 94/54 (!) 90/52  Pulse: 85 79  Resp: (!) 22 16  Temp:      Last Pain:  Vitals:   10/29/16 1130  TempSrc:   PainSc: 0-No pain                 Rusell Meneely S

## 2016-10-30 ENCOUNTER — Encounter (HOSPITAL_BASED_OUTPATIENT_CLINIC_OR_DEPARTMENT_OTHER): Payer: Self-pay | Admitting: General Surgery

## 2016-10-30 LAB — POCT HEMOGLOBIN-HEMACUE: Hemoglobin: 12.2 g/dL (ref 12.0–15.0)

## 2016-12-23 NOTE — Addendum Note (Signed)
Addendum  created 12/23/16 1307 by Eilene Ghaziose, Adela Esteban, MD   Sign clinical note

## 2017-01-05 ENCOUNTER — Ambulatory Visit (HOSPITAL_COMMUNITY)
Admission: EM | Admit: 2017-01-05 | Discharge: 2017-01-05 | Disposition: A | Discharge status: Home / Self Care | Payer: Medicaid Other | Attending: Internal Medicine | Admitting: Internal Medicine

## 2017-01-05 ENCOUNTER — Encounter (HOSPITAL_COMMUNITY): Payer: Self-pay | Admitting: Emergency Medicine

## 2017-01-05 DIAGNOSIS — J029 Acute pharyngitis, unspecified: Secondary | ICD-10-CM

## 2017-01-05 DIAGNOSIS — R112 Nausea with vomiting, unspecified: Secondary | ICD-10-CM

## 2017-01-05 DIAGNOSIS — R221 Localized swelling, mass and lump, neck: Secondary | ICD-10-CM

## 2017-01-05 MED ORDER — ONDANSETRON 4 MG PO TBDP: 4.0000 mg | ORAL_TABLET | Freq: Three times a day (TID) | ORAL | 0 refills | Status: DC | PRN

## 2017-01-05 MED ORDER — IPRATROPIUM BROMIDE 0.06 % NA SOLN: 2.0000 | Freq: Four times a day (QID) | NASAL | 0 refills | Status: DC

## 2017-01-05 MED ORDER — LIDOCAINE VISCOUS 2 % MT SOLN: OROMUCOSAL | 0 refills | Status: DC

## 2017-01-05 NOTE — ED Provider Notes (Signed)
CSN: [id number]     Arrival date & time 01/05/17  1341 History   None    Chief Complaint  Patient presents with  . Emesis   (Consider location/radiation/quality/duration/timing/severity/associated sxs/prior Treatment) Patient c/o sore throat and nausea x 3 days   The history is provided by the patient.  Emesis  Severity:  Moderate Duration:  3 days Timing:  Constant Recent urination:  Normal Relieved by:  Nothing Worsened by:  Nothing Associated symptoms: sore throat     Past Medical History:  Diagnosis Date  . Cochlear implant in place    age 89  . Migraines   . Sensorineural hearing loss (SNHL) of both ears    can read lips but unsure about medical terms   Past Surgical History:  Procedure Laterality Date  . COCHLEAR IMPLANT Right age 21  . FOREIGN BODY REMOVAL Left 10/29/2016   Procedure: EXCISION FOREIGN BODY LEFT ARM;  Surgeon: De BlanchLuke Aaron Kinsinger, MD;  [facility] [place];  Service: General;  Laterality: Left;  . NORPLANT REMOVAL Left 05/15/2016   Procedure: Attempted REMOVAL OF Nexplanon;  Surgeon: Allie BossierMyra C Dove, MD;  Location: [facility];  Service: Gynecology;  Laterality: Left;  Left upper arm.   Family History  Problem Relation Age of Onset  . Hypertension Mother   . Hyperlipidemia Father   . Prostate cancer Father    Social History  Substance Use Topics  . Smoking status: Never Smoker  . Smokeless tobacco: Never Used  . Alcohol use Yes     Comment: occasional   OB History    No data available     Review of Systems  Constitutional: Positive for fatigue.  HENT: Positive for sore throat.   Eyes: Negative.   Respiratory: Negative.   Cardiovascular: Negative.   Gastrointestinal: Positive for nausea and vomiting.  Endocrine: Negative.   Genitourinary: Negative.   Musculoskeletal: Negative.   Skin: Negative.   Allergic/Immunologic: Negative.   Neurological: Negative.   Hematological: Negative.   Psychiatric/Behavioral: Negative.      Allergies  Patient has no known allergies.  Home Medications   Prior to Admission medications   Medication Sig Start Date End Date Taking? Authorizing Provider  ipratropium (ATROVENT) 0.06 % nasal spray Place 2 sprays into both nostrils 4 (four) times daily. 01/05/17   Deatra Canterxford, Viona Hosking J, FNP  lidocaine (XYLOCAINE) 2 % solution 10 ml po q 3 hours prn throat pain 01/05/17   Deatra Canterxford, Alise Calais J, FNP  ondansetron (ZOFRAN ODT) 4 MG disintegrating tablet Take 1 tablet (4 mg total) by mouth every 8 (eight) hours as needed for nausea or vomiting. 01/05/17   Deatra Canterxford, Precious Gilchrest J, FNP   Meds Ordered and Administered this Visit  Medications - No data to display  BP (!) 135/93 (BP Location: Right Arm)   Pulse (!) 102   Temp 99.2 F (37.3 C) (Oral)   Resp 18   SpO2 98%  No data found.   Physical Exam  Constitutional: She is oriented to person, place, and time. She appears well-developed and well-nourished.  HENT:  Head: Normocephalic and atraumatic.  Uvula erththematous and edematous  Eyes: Conjunctivae and EOM are normal. Pupils are equal, round, and reactive to light.  Neck: Normal range of motion. Neck supple.  Cardiovascular: Normal rate, regular rhythm and normal heart sounds.   Pulmonary/Chest: Effort normal.  Abdominal: Soft. Bowel sounds are normal.  Neurological: She is alert and oriented to person, place, and time.  Nursing note and vitals reviewed.  Urgent Care Course     Procedures (including critical care time)  Labs Review Labs Reviewed - No data to display  Imaging Review No results found.   Visual Acuity Review  Right Eye Distance:   Left Eye Distance:   Bilateral Distance:    Right Eye Near:   Left Eye Near:    Bilateral Near:         MDM   1. Nausea and vomiting, intractability of vomiting not specified, unspecified vomiting type   2. Swollen uvula   3. Acute pharyngitis, unspecified etiology    Atrovent nasal spray Lidocaine  solution zofran    Deatra Canter, FNP 01/05/17 2124

## 2017-01-05 NOTE — ED Notes (Signed)
Video Interpreter # [id number] used for intake and provider consultation.

## 2017-01-05 NOTE — ED Triage Notes (Signed)
[facility] 3 days and emesis that started today.

## 2017-01-14 ENCOUNTER — Ambulatory Visit (INDEPENDENT_AMBULATORY_CARE_PROVIDER_SITE_OTHER): Payer: Medicaid Other | Admitting: Obstetrics & Gynecology

## 2017-01-14 ENCOUNTER — Encounter: Payer: Self-pay | Admitting: Obstetrics & Gynecology

## 2017-01-14 VITALS — BP 110/94 | HR 89 | Wt 136.3 lb

## 2017-01-14 DIAGNOSIS — L739 Follicular disorder, unspecified: Secondary | ICD-10-CM

## 2017-01-14 MED ORDER — SULFAMETHOXAZOLE-TRIMETHOPRIM 800-160 MG PO TABS: 1.0000 | ORAL_TABLET | Freq: Two times a day (BID) | ORAL | 1 refills | Status: DC

## 2017-01-14 NOTE — Progress Notes (Signed)
[facility] Interpreter Berneice GandyLori King

## 2017-01-14 NOTE — Progress Notes (Signed)
   Subjective:    Patient ID: Michele Dixon, female    DOB: 11/05/1995, 21 y.o.   MRN: [id number]  HPI 21 yo S AA G0 here for a recurrent pus filled structure on her right groin. Her mother and her sister have these issues as well.   Review of Systems     Objective:   Physical Exam [id number] Breathing, conversing, and ambulating normally ASL interpretor present She has a 1 cm pus extruding area on her right groin       Assessment & Plan:  Folliculitis- bactrim prescribed Rec that she use condoms as her Nuvaring may work less effectively

## 2017-02-13 ENCOUNTER — Encounter (HOSPITAL_COMMUNITY): Payer: Self-pay | Admitting: *Deleted

## 2017-02-13 ENCOUNTER — Ambulatory Visit (HOSPITAL_COMMUNITY)
Admission: EM | Admit: 2017-02-13 | Discharge: 2017-02-13 | Disposition: A | Discharge status: Home / Self Care | Payer: Medicaid Other | Attending: Internal Medicine | Admitting: Internal Medicine

## 2017-02-13 DIAGNOSIS — R300 Dysuria: Secondary | ICD-10-CM | POA: Diagnosis not present

## 2017-02-13 DIAGNOSIS — N390 Urinary tract infection, site not specified: Secondary | ICD-10-CM | POA: Diagnosis not present

## 2017-02-13 DIAGNOSIS — Z3202 Encounter for pregnancy test, result negative: Secondary | ICD-10-CM

## 2017-02-13 LAB — POCT URINALYSIS DIP (DEVICE)
BILIRUBIN URINE: NEGATIVE
GLUCOSE, UA: NEGATIVE mg/dL
Ketones, ur: NEGATIVE mg/dL
NITRITE: NEGATIVE
Protein, ur: 100 mg/dL — AB
Specific Gravity, Urine: >=1.03 (ref 1.005–1.030)
Urobilinogen, UA: 0.2 mg/dL (ref 0.0–1.0)
pH: 7 (ref 5.0–8.0)

## 2017-02-13 LAB — POCT PREGNANCY, URINE: Preg Test, Ur: NEGATIVE

## 2017-02-13 MED ORDER — PHENAZOPYRIDINE HCL 200 MG PO TABS: 200.0000 mg | ORAL_TABLET | Freq: Three times a day (TID) | ORAL | 0 refills | Status: DC

## 2017-02-13 MED ORDER — CEPHALEXIN 500 MG PO CAPS: 500.0000 mg | ORAL_CAPSULE | Freq: Four times a day (QID) | ORAL | 0 refills | Status: DC

## 2017-02-13 NOTE — ED Triage Notes (Signed)
Burning  On  Urination     With itching       Symptoms  X  4   Days     Pt  Is   Deaf          She     Sensation  Of  Not  emptying  Bladder  Not   Emptying     X   4  Days

## 2017-02-13 NOTE — ED Notes (Signed)
[employer]  Interpretors  Utilized   

## 2017-02-13 NOTE — ED Provider Notes (Signed)
CSN: [id number]     Arrival date & time 02/13/17  1631 History   None    Chief Complaint  Patient presents with  . Urinary Tract Infection   (Consider location/radiation/quality/duration/timing/severity/associated sxs/prior Treatment) Patient c/o dysuria for 4 days   The history is provided by the patient.  Urinary Tract Infection  Pain quality:  Aching and burning Pain severity:  Mild Onset quality:  Sudden Duration:  4 days Timing:  Constant Progression:  Worsening Chronicity:  New Recent urinary tract infections: yes   Relieved by:  Nothing Worsened by:  Nothing Ineffective treatments:  None tried   Past Medical History:  Diagnosis Date  . Cochlear implant in place    age 89  . Migraines   . Sensorineural hearing loss (SNHL) of both ears    can read lips but unsure about medical terms   Past Surgical History:  Procedure Laterality Date  . COCHLEAR IMPLANT Right age 89  . FOREIGN BODY REMOVAL Left 10/29/2016   Procedure: EXCISION FOREIGN BODY LEFT ARM;  Surgeon: De BlanchLuke Aaron Kinsinger, MD;  [facility] [place];  Service: General;  Laterality: Left;  . NORPLANT REMOVAL Left 05/15/2016   Procedure: Attempted REMOVAL OF Nexplanon;  Surgeon: Allie BossierMyra C Dove, MD;  Location: [facility];  Service: Gynecology;  Laterality: Left;  Left upper arm.   Family History  Problem Relation Age of Onset  . Hypertension Mother   . Hyperlipidemia Father   . Prostate cancer Father    Social History  Substance Use Topics  . Smoking status: Never Smoker  . Smokeless tobacco: Never Used  . Alcohol use Yes     Comment: occasional   OB History    No data available     Review of Systems  Constitutional: Negative.   HENT: Negative.   Eyes: Negative.   Cardiovascular: Negative.   Gastrointestinal: Negative.   Endocrine: Negative.   Genitourinary: Positive for dysuria.  Musculoskeletal: Negative.   Allergic/Immunologic: Negative.   Neurological: Negative.   Hematological:  Negative.   Psychiatric/Behavioral: Negative.     Allergies  Patient has no known allergies.  Home Medications   Prior to Admission medications   Medication Sig Start Date End Date Taking? Authorizing Provider  cephALEXin (KEFLEX) 500 MG capsule Take 1 capsule (500 mg total) by mouth 4 (four) times daily. 02/13/17   Deatra Canterxford, Maalik Pinn J, FNP  ipratropium (ATROVENT) 0.06 % nasal spray Place 2 sprays into both nostrils 4 (four) times daily. 01/05/17   Deatra Canterxford, Kiyara Bouffard J, FNP  lidocaine (XYLOCAINE) 2 % solution 10 ml po q 3 hours prn throat pain 01/05/17   Deatra Canterxford, Glynis Hunsucker J, FNP  ondansetron (ZOFRAN ODT) 4 MG disintegrating tablet Take 1 tablet (4 mg total) by mouth every 8 (eight) hours as needed for nausea or vomiting. 01/05/17   Deatra Canterxford, Ramey Ketcherside J, FNP  phenazopyridine (PYRIDIUM) 200 MG tablet Take 1 tablet (200 mg total) by mouth 3 (three) times daily. 02/13/17   Deatra Canterxford, Amrit Cress J, FNP  sulfamethoxazole-trimethoprim (BACTRIM DS,SEPTRA DS) 800-160 MG tablet Take 1 tablet by mouth 2 (two) times daily. 01/14/17   Allie Bossierove, Myra C, MD   Meds Ordered and Administered this Visit  Medications - No data to display  BP (!) 126/94 (BP Location: Right Arm)   Pulse 78   Temp 99 F (37.2 C) (Oral)   Resp 18   LMP 01/22/2017   SpO2 100%  No data found.   Physical Exam  Constitutional: She is oriented to person, place, and  time. She appears well-developed and well-nourished.  HENT:  Head: Normocephalic and atraumatic.  Eyes: Pupils are equal, round, and reactive to light. Conjunctivae and EOM are normal.  Neck: Normal range of motion. Neck supple.  Cardiovascular: Normal rate, regular rhythm and normal heart sounds.   Pulmonary/Chest: Effort normal and breath sounds normal.  Abdominal: Soft. Bowel sounds are normal.  Neurological: She is alert and oriented to person, place, and time.  Nursing note and vitals reviewed.   Urgent Care Course     Procedures (including critical care time)  Labs  Review Labs Reviewed  POCT URINALYSIS DIP (DEVICE) - Abnormal; Notable for the following:       Result Value   Hgb urine dipstick TRACE (*)    Protein, ur 100 (*)    Leukocytes, UA TRACE (*)    All other components within normal limits  POCT PREGNANCY, URINE    Imaging Review No results found.   Visual Acuity Review  Right Eye Distance:   Left Eye Distance:   Bilateral Distance:    Right Eye Near:   Left Eye Near:    Bilateral Near:         MDM   1. Lower urinary tract infectious disease   2. Dysuria    Keflex 500mg  one po qid x 7 days #28     Deatra CanterOxford, Lurline Caver J, OregonFNP 02/13/17 1802

## 2017-02-16 IMAGING — CR DG HUMERUS 2V *L*
2 series · 2 of 2 positions shown · non-contrast
Comparison: None.

CLINICAL DATA: Evaluate for implantable birth control device.

EXAM:
LEFT HUMERUS - 2+ VIEW

[view not recorded (1 of 2)]
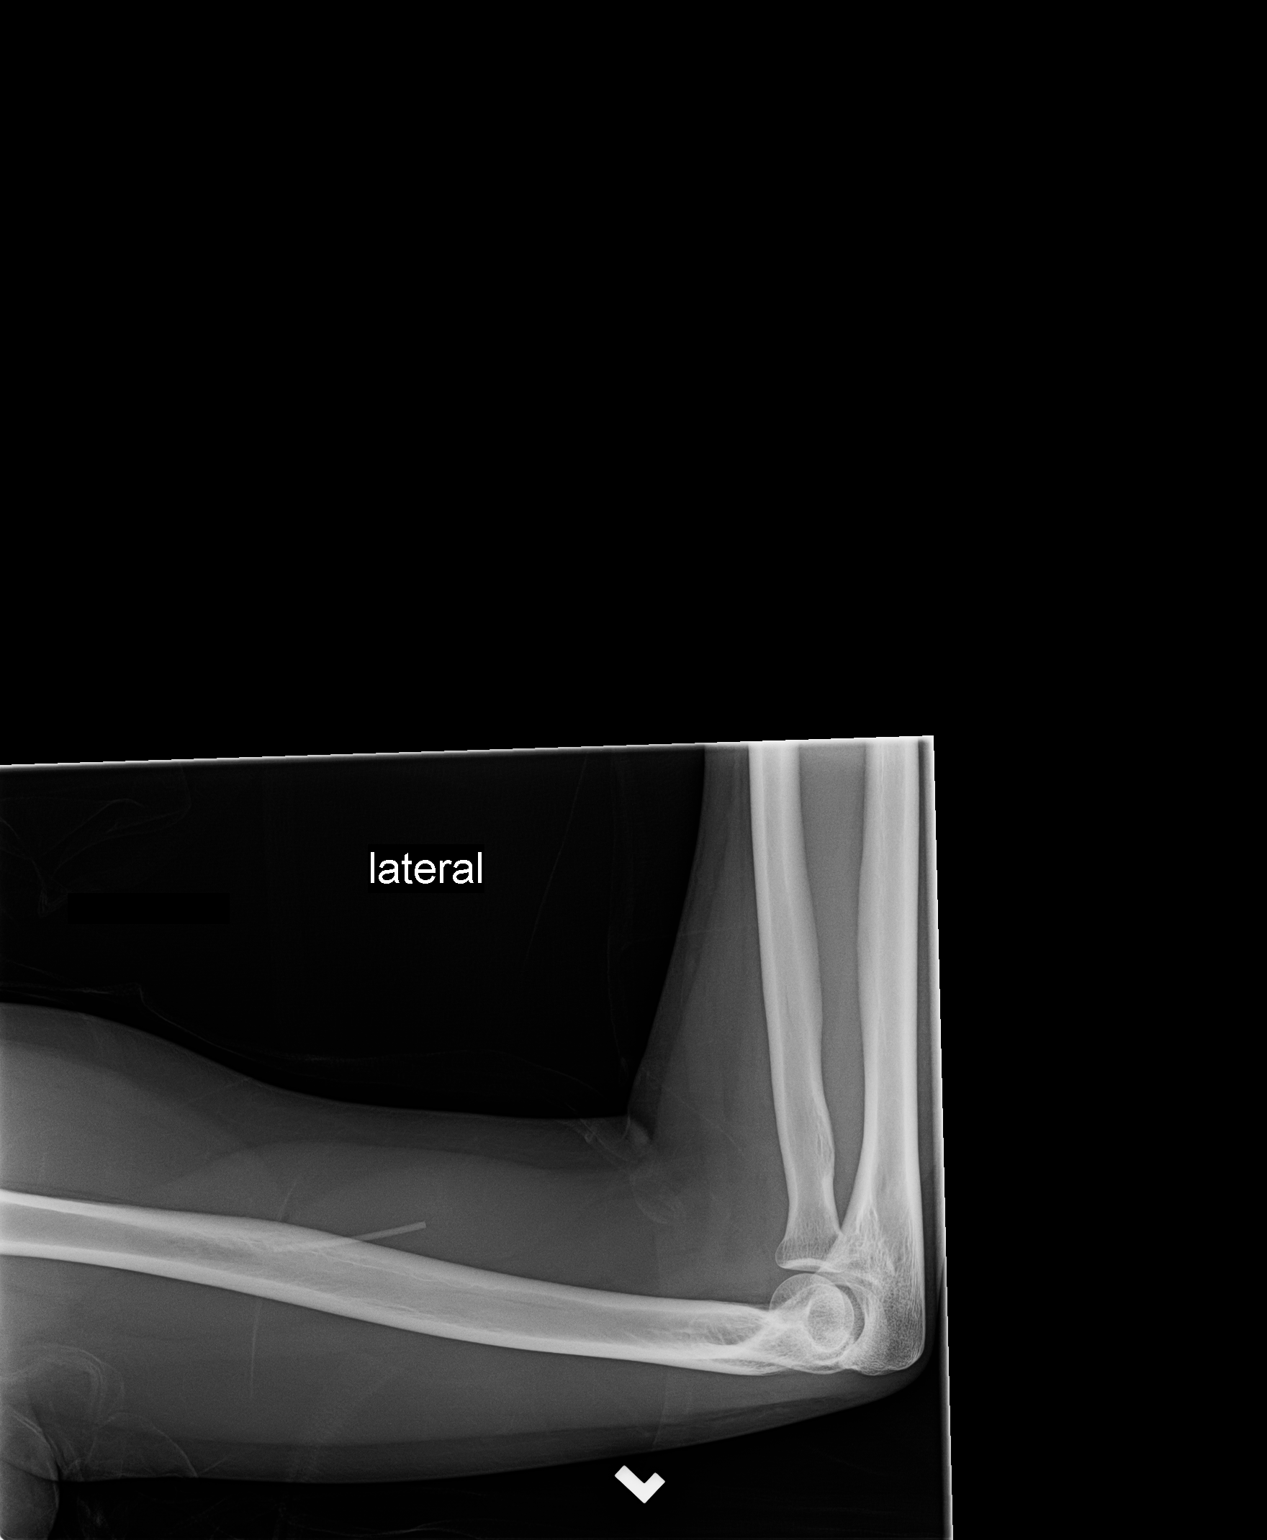

[view not recorded (2 of 2)]
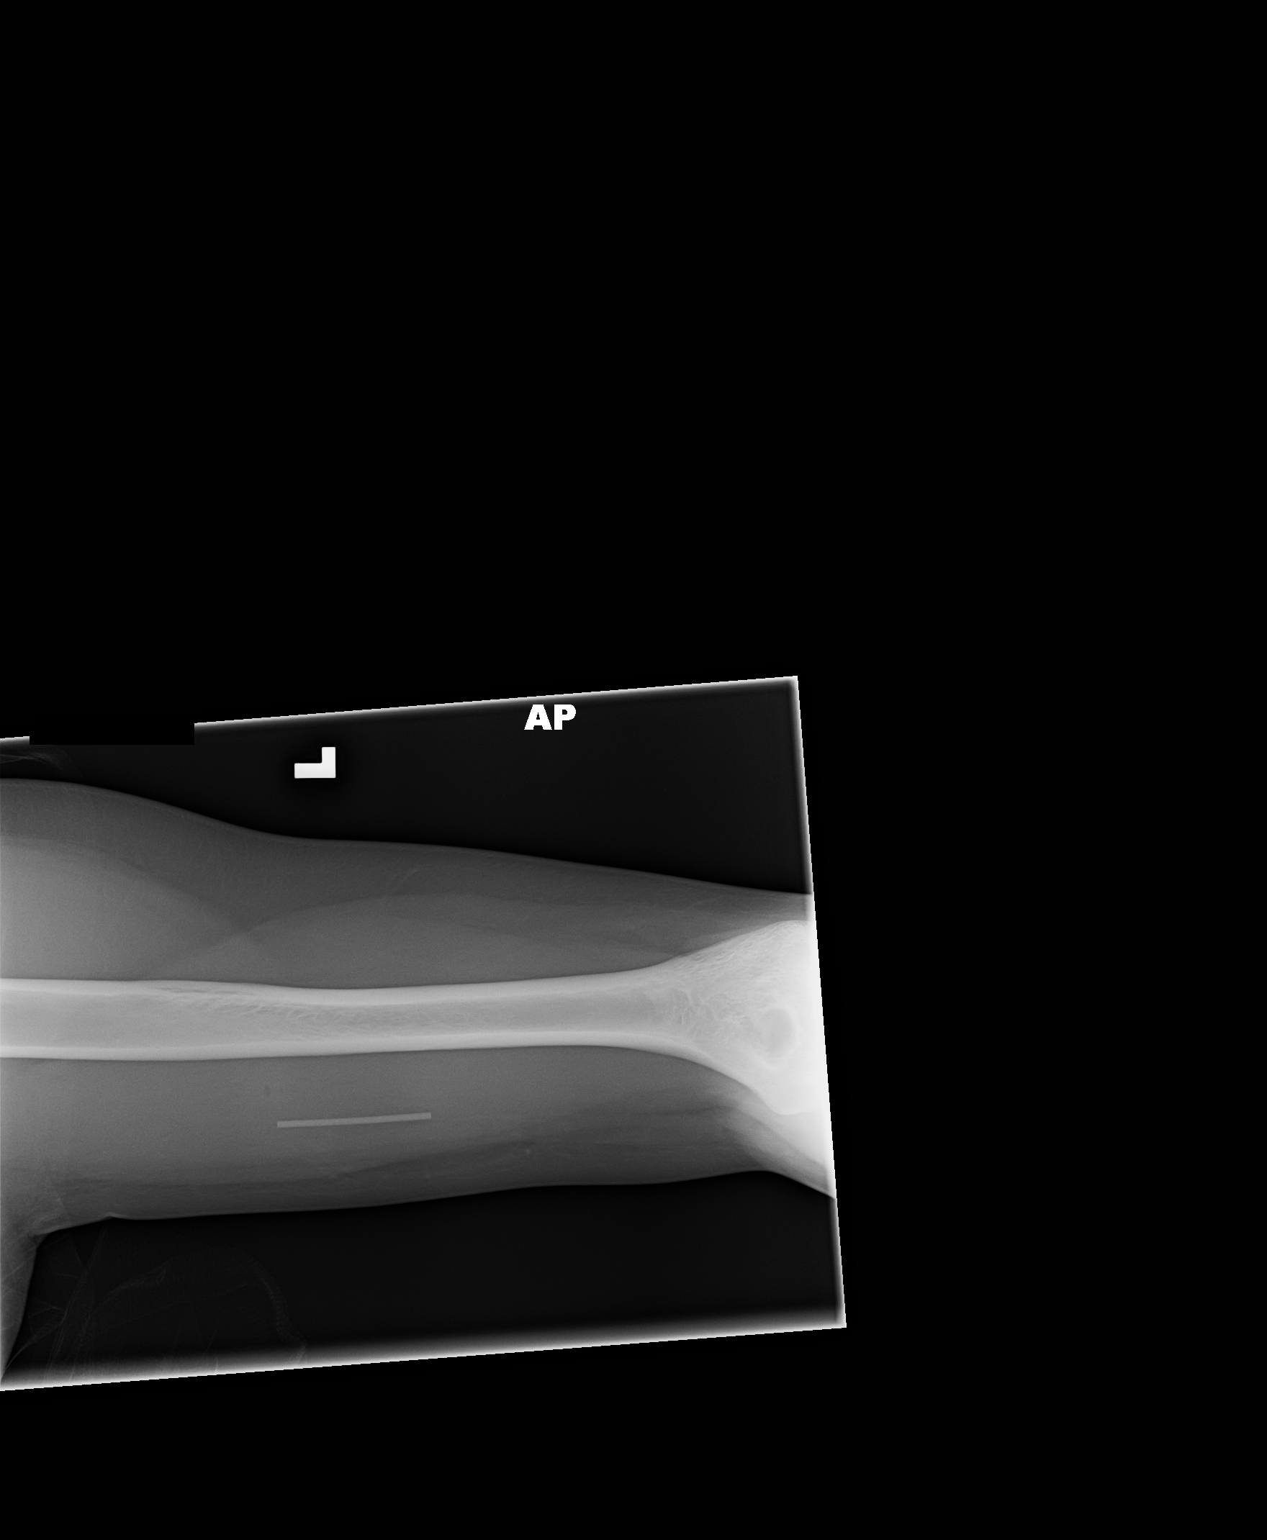

[2 of 2 positions shown; findings below may reference images not displayed]

FINDINGS: There is no evidence of fracture or other focal bone lesions. Soft
tissues are unremarkable. Within the medial soft tissues of the
forearm the implantable device is identified adjacent to the
midshaft of the humerus measuring 4.3 cm.
IMPRESSION: 1. Birth control implant identified within the medial soft tissues
of the left upper arm adjacent to the humerus.

## 2017-05-30 ENCOUNTER — Encounter (HOSPITAL_COMMUNITY): Payer: Self-pay | Admitting: Emergency Medicine

## 2017-05-30 ENCOUNTER — Ambulatory Visit (INDEPENDENT_AMBULATORY_CARE_PROVIDER_SITE_OTHER): Payer: Medicaid Other

## 2017-05-30 ENCOUNTER — Ambulatory Visit (HOSPITAL_COMMUNITY)
Admission: EM | Admit: 2017-05-30 | Discharge: 2017-05-30 | Disposition: A | Discharge status: Home / Self Care | Payer: Medicaid Other | Attending: Internal Medicine | Admitting: Internal Medicine

## 2017-05-30 ENCOUNTER — Other Ambulatory Visit: Payer: Self-pay | Admitting: Obstetrics & Gynecology

## 2017-05-30 DIAGNOSIS — Z3202 Encounter for pregnancy test, result negative: Secondary | ICD-10-CM

## 2017-05-30 DIAGNOSIS — M25571 Pain in right ankle and joints of right foot: Secondary | ICD-10-CM | POA: Diagnosis not present

## 2017-05-30 LAB — POCT PREGNANCY, URINE: PREG TEST UR: NEGATIVE

## 2017-05-30 MED ORDER — NAPROXEN 500 MG PO TABS: 500.0000 mg | ORAL_TABLET | Freq: Two times a day (BID) | ORAL | 0 refills | Status: AC

## 2017-05-30 NOTE — ED Triage Notes (Signed)
Pt using ASL interpreter. Pt states she twisted her ankle 2 weeks ago. Pt standing at work all day. Pt c/o pain to R ankle with swelling.

## 2017-05-30 NOTE — Discharge Instructions (Signed)
Xray is negative for fracture or dislocation. Start naproxen as directed for pain and inflammation. Ice compress and elevation. Wear your ankle brace during activity. This can take up to 3-4 weeks to completely resolve, but it should be getting better each week. Follow up with PCP if symptoms do not improve/worsens.

## 2017-05-30 NOTE — ED Provider Notes (Signed)
[facility]    CSN: [id number] Arrival date & time: 05/30/17  1636     History   Chief Complaint Chief Complaint  Patient presents with  . Ankle Pain    HPI Michele Dixon is a 21 y.o. female.   21 year old female comes in for 2 week history of right ankle pain after twisting the ankle 2 weeks ago. Patient states she inverted ankle and has since been having pain with swelling. She has been wearing ankle brace with little improvement. States pain is worse every time she bears weight. Has not taken anything for the pain. Denies numbness/tingling.        Past Medical History:  Diagnosis Date  . Cochlear implant in place    age 89  . Migraines   . Sensorineural hearing loss (SNHL) of both ears    can read lips but unsure about medical terms    Patient Active Problem List   Diagnosis Date Noted  . Exudative pharyngitis 01/01/2013  . Thrush 01/01/2013  . FUO (fever of unknown origin) 01/01/2013  . Syncope and collapse 01/01/2013  . 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 Otalgia 01/01/2013  . Headache(784.0) 01/01/2013  . Fever and chills 12/31/2012  . Leukocytosis 12/31/2012  . Hyponatremia 12/31/2012  . Acute low back pain 12/31/2012  . Tachycardia 12/31/2012    Past Surgical History:  Procedure Laterality Date  . COCHLEAR IMPLANT Right age 89    OB History    No data available       Home Medications    Prior to Admission medications   Medication Sig Start Date End Date Taking? Authorizing Provider  naproxen (NAPROSYN) 500 MG tablet Take 1 tablet (500 mg total) 2 (two) times daily for 10 days by mouth. 05/30/17 06/09/17  Belinda FisherYu, Amy V, PA-C    Family History Family History  Problem Relation Age of Onset  . Hypertension Mother   . Hyperlipidemia Father   . Prostate cancer Father     Social History Social History   Tobacco Use  . Smoking status: Never Smoker  . Smokeless tobacco: Never Used  Substance Use Topics  . Alcohol use: Yes   Comment: occasional  . Drug use: No     Allergies   Patient has no known allergies.   Review of Systems Review of Systems  Reason unable to perform ROS: See HPI as above.     Physical Exam Triage Vital Signs ED Triage Vitals  Enc Vitals Group     BP 05/30/17 1704 126/82     Pulse Rate 05/30/17 1704 (!) 113     Resp 05/30/17 1704 18     Temp 05/30/17 1704 99 F (37.2 C)     Temp Source 05/30/17 1704 Oral     SpO2 05/30/17 1704 100 %     Weight --      Height --      Head Circumference --      Peak Flow --      Pain Score 05/30/17 1702 7     Pain Loc --      Pain Edu? --      Excl. in GC? --    No data found.  Updated Vital Signs BP 126/82   Pulse (!) 113   Temp 99 F (37.2 C) (Oral)   Resp 18   LMP  (LMP Unknown) Comment: late on LMP, pregnancy test negative  SpO2 100%   Physical Exam  Constitutional: She is  oriented to person, place, and time. She appears well-developed and well-nourished. No distress.  HENT:  Head: Normocephalic and atraumatic.  Eyes: Conjunctivae are normal. Pupils are equal, round, and reactive to light.  Musculoskeletal:  No obvious swelling noted. Tenderness on palpation of the medial malleolus and proximal dorsal foot. Full ROM of ankle, though with pain. Strength normal and equal bilaterally. Sensation intact. Pedal pulses 2+  Neurological: She is alert and oriented to person, place, and time.    UC Treatments / Results  Labs (all labs ordered are listed, but only abnormal results are displayed) Labs Reviewed  POCT PREGNANCY, URINE    EKG  EKG Interpretation None       Radiology Dg Ankle Complete Right  Result Date: 05/30/2017 CLINICAL DATA:  Fall, anterior RIGHT ankle EXAM: RIGHT ANKLE - COMPLETE 3+ VIEW COMPARISON:  None. FINDINGS: Ankle mortise intact. The talar dome is normal. No malleolar fracture. The calcaneus is normal. IMPRESSION: No fracture or dislocation. Electronically Signed   By: Genevive BiStewart  Edmunds M.D.    On: 05/30/2017 17:41    Procedures Procedures (including critical care time)  Medications Ordered in UC Medications - No data to display   Initial Impression / Assessment and Plan / UC Course  I have reviewed the triage vital signs and the nursing notes.  Pertinent labs & imaging results that were available during my care of the patient were reviewed by me and considered in my medical decision making (see chart for details).    Xray negative. Naproxen for pain/inflammation. Ice compress, elevation. Ankle brace during activity. Discussed with patient this can take up to 2-3 weeks to completely resolve, but should be feeling better each week. Return precautions given.   Final Clinical Impressions(s) / UC Diagnoses   Final diagnoses:  Acute right ankle pain    ED Discharge Orders        Ordered    naproxen (NAPROSYN) 500 MG tablet  2 times daily     05/30/17 1811       Belinda FisherYu, Amy V, PA-C 05/30/17 1819

## 2017-07-22 NOTE — L&D Delivery Note (Signed)
Patient is a 22 y.o. now G1P1 s/p NSVD at 5184w5d, who was admitted for SROM @0454  on 8/4.  She progressed with augmentation (Pitocin, AROM of forebag) to complete and pushed 1hr 45minutes to deliver.  Cord clamping delayed by several minutes then clamped by CNM and cut by FOB. After 31 minutes of cord traction and fundal massage placenta was not delivered. In and out catheter and Dr Erin FullingHarraway Smith called to bedside. Placenta manually removed and intact- sent to Pathology, bleeding minimal.  Periurethral lacerations identified- not repaired, hemostatic.  Mom and baby stable prior to transfer to postpartum. She plans on breastfeeding. She requests BTL for birth control.  Delivery Note At 1:15 AM a viable and healthy female was delivered via Vaginal, Spontaneous (Presentation: ROA ).  APGAR: 8, 9; weight pending.   Placenta manually removed by Dr Erin FullingHarraway Smith and intact on removal- 3VCord:  with the following complications: retained placenta after delivery.  Cord pH: pending  Anesthesia:  Epidural  Episiotomy: None Lacerations:  Periurethral  Suture Repair: None Est. Blood Loss (mL):  250mL  Mom to postpartum.  Baby to Couplet care / Skin to Skin.  Sharyon CableVeronica C Rembert Dixon CNM 02/23/2018, 2:12 AM

## 2017-07-24 ENCOUNTER — Inpatient Hospital Stay (HOSPITAL_COMMUNITY)
Admission: AD | Admit: 2017-07-24 | Discharge: 2017-07-24 | Disposition: A | Discharge status: Home / Self Care | Payer: Medicaid Other | Source: Ambulatory Visit | Attending: Obstetrics & Gynecology | Admitting: Obstetrics & Gynecology

## 2017-07-24 ENCOUNTER — Encounter (HOSPITAL_COMMUNITY): Payer: Self-pay | Admitting: *Deleted

## 2017-07-24 ENCOUNTER — Other Ambulatory Visit: Payer: Self-pay

## 2017-07-24 DIAGNOSIS — Z3A10 10 weeks gestation of pregnancy: Secondary | ICD-10-CM | POA: Diagnosis not present

## 2017-07-24 DIAGNOSIS — Z8042 Family history of malignant neoplasm of prostate: Secondary | ICD-10-CM | POA: Insufficient documentation

## 2017-07-24 DIAGNOSIS — O219 Vomiting of pregnancy, unspecified: Secondary | ICD-10-CM | POA: Diagnosis not present

## 2017-07-24 DIAGNOSIS — Z8379 Family history of other diseases of the digestive system: Secondary | ICD-10-CM | POA: Diagnosis not present

## 2017-07-24 DIAGNOSIS — Z8249 Family history of ischemic heart disease and other diseases of the circulatory system: Secondary | ICD-10-CM | POA: Insufficient documentation

## 2017-07-24 DIAGNOSIS — O99351 Diseases of the nervous system complicating pregnancy, first trimester: Secondary | ICD-10-CM | POA: Insufficient documentation

## 2017-07-24 DIAGNOSIS — G43009 Migraine without aura, not intractable, without status migrainosus: Secondary | ICD-10-CM | POA: Diagnosis not present

## 2017-07-24 DIAGNOSIS — O26891 Other specified pregnancy related conditions, first trimester: Secondary | ICD-10-CM | POA: Diagnosis not present

## 2017-07-24 DIAGNOSIS — Z87440 Personal history of urinary (tract) infections: Secondary | ICD-10-CM | POA: Insufficient documentation

## 2017-07-24 DIAGNOSIS — H903 Sensorineural hearing loss, bilateral: Secondary | ICD-10-CM | POA: Insufficient documentation

## 2017-07-24 DIAGNOSIS — R51 Headache: Secondary | ICD-10-CM | POA: Diagnosis present

## 2017-07-24 DIAGNOSIS — Z9621 Cochlear implant status: Secondary | ICD-10-CM | POA: Diagnosis not present

## 2017-07-24 HISTORY — DX: Unspecified infectious disease: B99.9

## 2017-07-24 HISTORY — DX: Unspecified ovarian cyst, unspecified side: N83.209

## 2017-07-24 LAB — COMPREHENSIVE METABOLIC PANEL
ALBUMIN: 3.4 g/dL — AB (ref 3.5–5.0)
ALK PHOS: 37 U/L — AB (ref 38–126)
ALT: 11 U/L — ABNORMAL LOW (ref 14–54)
AST: 16 U/L (ref 15–41)
Anion gap: 7 (ref 5–15)
BILIRUBIN TOTAL: 0.6 mg/dL (ref 0.3–1.2)
BUN: 5 mg/dL — AB (ref 6–20)
CO2: 21 mmol/L — ABNORMAL LOW (ref 22–32)
Calcium: 8.9 mg/dL (ref 8.9–10.3)
Chloride: 106 mmol/L (ref 101–111)
Creatinine, Ser: 0.39 mg/dL — ABNORMAL LOW (ref 0.44–1.00)
GFR calc Af Amer: >60 mL/min (ref >60)
GFR calc non Af Amer: >60 mL/min (ref >60)
GLUCOSE: 83 mg/dL (ref 65–99)
Potassium: 4 mmol/L (ref 3.5–5.1)
Sodium: 134 mmol/L — ABNORMAL LOW (ref 135–145)
TOTAL PROTEIN: 7.2 g/dL (ref 6.5–8.1)

## 2017-07-24 LAB — URINALYSIS, ROUTINE W REFLEX MICROSCOPIC
Bilirubin Urine: NEGATIVE
Glucose, UA: NEGATIVE mg/dL
Hgb urine dipstick: NEGATIVE
KETONES UR: NEGATIVE mg/dL
LEUKOCYTES UA: NEGATIVE
NITRITE: NEGATIVE
PH: 6 (ref 5.0–8.0)
Protein, ur: NEGATIVE mg/dL
SPECIFIC GRAVITY, URINE: 1.02 (ref 1.005–1.030)

## 2017-07-24 MED ORDER — LACTATED RINGERS IV BOLUS (SEPSIS)
1000.0000 mL | Freq: Once | INTRAVENOUS | Status: AC
  Administered 2017-07-24: 1000 mL via INTRAVENOUS

## 2017-07-24 MED ORDER — DIPHENHYDRAMINE HCL 50 MG/ML IJ SOLN
25.0000 mg | Freq: Once | INTRAMUSCULAR | Status: AC
  Administered 2017-07-24: 25 mg via INTRAVENOUS
  Filled 2017-07-24: qty 1

## 2017-07-24 MED ORDER — DEXAMETHASONE SODIUM PHOSPHATE 10 MG/ML IJ SOLN
10.0000 mg | Freq: Once | INTRAMUSCULAR | Status: AC
  Administered 2017-07-24: 10 mg via INTRAVENOUS
  Filled 2017-07-24: qty 1

## 2017-07-24 MED ORDER — METOCLOPRAMIDE HCL 10 MG PO TABS: 10.0000 mg | ORAL_TABLET | Freq: Three times a day (TID) | ORAL | 0 refills | Status: DC | PRN

## 2017-07-24 MED ORDER — METOCLOPRAMIDE HCL 5 MG/ML IJ SOLN
10.0000 mg | Freq: Once | INTRAMUSCULAR | Status: AC
  Administered 2017-07-24: 10 mg via INTRAVENOUS
  Filled 2017-07-24: qty 2

## 2017-07-24 MED ORDER — CYCLOBENZAPRINE HCL 10 MG PO TABS: 10.0000 mg | ORAL_TABLET | Freq: Two times a day (BID) | ORAL | 0 refills | Status: DC | PRN

## 2017-07-24 NOTE — Discharge Instructions (Signed)
Migraine Headache A migraine headache is an intense, throbbing pain on one side or both sides of the head. Migraines may also cause other symptoms, such as nausea, vomiting, and sensitivity to light and noise. What are the causes? Doing or taking certain things may also trigger migraines, such as:  Alcohol.  Smoking.  Medicines, such as: ? Medicine used to treat chest pain (nitroglycerine). ? Birth control pills. ? Estrogen pills. ? Certain blood pressure medicines.  Aged cheeses, chocolate, or caffeine.  Foods or drinks that contain nitrates, glutamate, aspartame, or tyramine.  Physical activity.  Other things that may trigger a migraine include:  Menstruation.  Pregnancy.  Hunger.  Stress, lack of sleep, too much sleep, or fatigue.  Weather changes.  What increases the risk? The following factors may make you more likely to experience migraine headaches:  Age. Risk increases with age.  Family history of migraine headaches.  Being Caucasian.  Depression and anxiety.  Obesity.  Being a woman.  Having a hole in the heart (patent foramen ovale) or other heart problems.  What are the signs or symptoms? The main symptom of this condition is pulsating or throbbing pain. Pain may:  Happen in any area of the head, such as on one side or both sides.  Interfere with daily activities.  Get worse with physical activity.  Get worse with exposure to bright lights or loud noises.  Other symptoms may include:  Nausea.  Vomiting.  Dizziness.  General sensitivity to bright lights, loud noises, or smells.  Before you get a migraine, you may get warning signs that a migraine is developing (aura). An aura may include:  Seeing flashing lights or having blind spots.  Seeing bright spots, halos, or zigzag lines.  Having tunnel vision or blurred vision.  Having numbness or a tingling feeling.  Having trouble talking.  Having muscle weakness.  How is this  diagnosed? A migraine headache can be diagnosed based on:  Your symptoms.  A physical exam.  Tests, such as CT scan or MRI of the head. These imaging tests can help rule out other causes of headaches.  Taking fluid from the spine (lumbar puncture) and analyzing it (cerebrospinal fluid analysis, or CSF analysis).  How is this treated? A migraine headache is usually treated with medicines that:  Relieve pain.  Relieve nausea.  Prevent migraines from coming back.  Treatment may also include:  Acupuncture.  Lifestyle changes like avoiding foods that trigger migraines.  Follow these instructions at home: Medicines  Take over-the-counter and prescription medicines only as told by your health care provider.  Do not drive or use heavy machinery while taking prescription pain medicine.  To prevent or treat constipation while you are taking prescription pain medicine, your health care provider may recommend that you: ? Drink enough fluid to keep your urine clear or pale yellow. ? Take over-the-counter or prescription medicines. ? Eat foods that are high in fiber, such as fresh fruits and vegetables, whole grains, and beans. ? Limit foods that are high in fat and processed sugars, such as fried and sweet foods. Lifestyle  Avoid alcohol use.  Do not use any products that contain nicotine or tobacco, such as cigarettes and e-cigarettes. If you need help quitting, ask your health care provider.  Get at least 8 hours of sleep every night.  Limit your stress. General instructions   Keep a journal to find out what may trigger your migraine headaches. For example, write down: ? What you eat and  drink. °? How much sleep you get. °? Any change to your diet or medicines. °· If you have a migraine: °? Avoid things that make your symptoms worse, such as bright lights. °? It may help to lie down in a dark, quiet room. °? Do not drive or use heavy machinery. °? Ask your health care provider  what activities are safe for you while you are experiencing symptoms. °· Keep all follow-up visits as told by your health care provider. This is important. °Contact a health care provider if: °· You develop symptoms that are different or more severe than your usual migraine symptoms. °Get help right away if: °· Your migraine becomes severe. °· You have a fever. °· You have a stiff neck. °· You have vision loss. °· Your muscles feel weak or like you cannot control them. °· You start to lose your balance often. °· You develop trouble walking. °· You faint. °This information is not intended to replace advice given to you by your health care provider. Make sure you discuss any questions you have with your health care provider. °Document Released: 07/08/2005 Document Revised: 01/26/2016 Document Reviewed: 12/25/2015 °Elsevier Interactive Patient Education © 2017 [employer] Inc[employer] °Morning Sickness °Morning sickness is when you feel sick to your stomach (nauseous) during pregnancy. You may feel sick to your stomach and throw up (vomit). You may feel sick in the morning, but you can feel this way any time of day. Some women feel very sick to their stomach and cannot stop throwing up (hyperemesis gravidarum). °Follow these instructions at home: °· Only take medicines as told by your doctor. °· Take multivitamins as told by your doctor. Taking multivitamins before getting pregnant can stop or lessen the harshness of morning sickness. °· Eat dry toast or unsalted crackers before getting out of bed. °· Eat 5 to 6 small meals a day. °· Eat dry and bland foods like rice and baked potatoes. °· Do not drink liquids with meals. Drink between meals. °· Do not eat greasy, fatty, or spicy foods. °· Have someone cook for you if the smell of food causes you to feel sick or throw up. °· If you feel sick to your stomach after taking prenatal vitamins, take them at night or with a snack. °· Eat protein when you need a snack (nuts, yogurt,  cheese). °· Eat unsweetened gelatins for dessert. °· Wear a bracelet used for sea sickness (acupressure wristband). °· Go to a doctor that puts thin needles into certain body points (acupuncture) to improve how you feel. °· Do not smoke. °· Use a humidifier to keep the air in your house free of odors. °· Get lots of fresh air. °Contact a doctor if: °· You need medicine to feel better. °· You feel dizzy or lightheaded. °· You are losing weight. °Get help right away if: °· You feel very sick to your stomach and cannot stop throwing up. °· You pass out (faint). °This information is not intended to replace advice given to you by your health care provider. Make sure you discuss any questions you have with your health care provider. °Document Released: 08/15/2004 Document Revised: 12/14/2015 Document Reviewed: 12/23/2012 °Elsevier Interactive Patient Education © 2017 Elsevier Inc. ° °

## 2017-07-24 NOTE — MAU Provider Note (Signed)
History     CSN: [id number]  Arrival date and time: 07/24/17 1130   First Provider Initiated Contact with Patient 07/24/17 1350      Chief Complaint  Patient presents with  . Headache  . Emesis  . diarhhea   HPI Michele Dixon is a 22 y.o. G1P0 at 1556w4d who presents with n/v/d & headache. Reports nausea for the last few weeks that worsened this morning. States she has vomited 20 times since last night. Also reports loose/watery stools x 4 episodes since this morning.  Right occipital headache since this morning. States feels like previous headaches. Took tylenol without relief. Rates pain 10/10. Lights make pain worse.  Denies fever/chills, abdominal pain, or vaginal bleeding. No sick contacts. To start prenatal care later this month at [facility].   Patient is deaf. Video ASL interpreter used.   OB History    Gravida Para Term Preterm AB Living   1             SAB TAB Ectopic Multiple Live Births                  Past Medical History:  Diagnosis Date  . Cochlear implant in place    age 89  . Infection    UTI  . Migraines   . Ovarian cyst    ~6th grade  . Sensorineural hearing loss (SNHL) of both ears    can read lips but unsure about medical terms    Past Surgical History:  Procedure Laterality Date  . COCHLEAR IMPLANT Right age 89  . FOREIGN BODY REMOVAL Left 10/29/2016   Procedure: EXCISION FOREIGN BODY LEFT ARM;  Surgeon: De BlanchLuke Aaron Kinsinger, MD;  [facility];  Service: General;  Laterality: Left;  . NORPLANT REMOVAL Left 05/15/2016   Procedure: Attempted REMOVAL OF Nexplanon;  Surgeon: Allie BossierMyra C Dove, MD;  Location: [facility];  Service: Gynecology;  Laterality: Left;  Left upper arm.    Family History  Problem Relation Age of Onset  . Hypertension Mother   . Hyperlipidemia Father   . Prostate cancer Father   . Pancreatitis Sister   . Pancreatitis Maternal Aunt   . Pancreatitis Maternal Grandmother     Social History   Tobacco Use  .  Smoking status: Never Smoker  . Smokeless tobacco: Never Used  Substance Use Topics  . Alcohol use: Yes    Comment: occasional  . Drug use: No    Allergies: No Known Allergies  Medications Prior to Admission  Medication Sig Dispense Refill Last Dose  . [DISCONTINUED] etonogestrel-ethinyl estradiol (NUVARING) 0.12-0.015 MG/24HR vaginal ring INSERT VAGINALLY AND LEAVE IN PLACE FOR 3 CONSECUTIVE WEEKS, THEN REMOVE FOR 1 WEEK. 3 each 12     Review of Systems  Constitutional: Negative.   Eyes: Positive for photophobia. Negative for visual disturbance.  Gastrointestinal: Positive for diarrhea, nausea and vomiting. Negative for abdominal pain and blood in stool.  Genitourinary: Negative.   Neurological: Positive for headaches. Negative for dizziness and seizures.   Physical Exam   Blood pressure 113/84, pulse 89, temperature 98.5 F (36.9 C), temperature source Oral, resp. rate 20, height 5\' 3"  (1.6 m), weight 137 lb 4 oz (62.3 kg), SpO2 100 %.  Physical Exam  Nursing note and vitals reviewed. Constitutional: She is oriented to person, place, and time. She appears well-developed and well-nourished. No distress.  HENT:  Head: Normocephalic and atraumatic.  Eyes: Conjunctivae are normal. Right eye exhibits no discharge. Left eye exhibits no  discharge. No scleral icterus.  Neck: Normal range of motion.  Cardiovascular: Normal rate, regular rhythm and normal heart sounds.  No murmur heard. Respiratory: Effort normal and breath sounds normal. No respiratory distress. She has no wheezes.  GI: Soft. Bowel sounds are normal. She exhibits no distension. There is no tenderness. There is no rebound.  Neurological: She is alert and oriented to person, place, and time.  Skin: Skin is warm and dry. She is not diaphoretic.  Psychiatric: She has a normal mood and affect. Her behavior is normal. Judgment and thought content normal.    MAU Course  Procedures Results for orders placed or performed  during the hospital encounter of 07/24/17 (from the past 24 hour(s))  Urinalysis, Routine w reflex microscopic     Status: None   Collection Time: 07/24/17 12:20 PM  Result Value Ref Range   Color, Urine YELLOW YELLOW   APPearance CLEAR CLEAR   Specific Gravity, Urine 1.020 1.005 - 1.030   pH 6.0 5.0 - 8.0   Glucose, UA NEGATIVE NEGATIVE mg/dL   Hgb urine dipstick NEGATIVE NEGATIVE   Bilirubin Urine NEGATIVE NEGATIVE   Ketones, ur NEGATIVE NEGATIVE mg/dL   Protein, ur NEGATIVE NEGATIVE mg/dL   Nitrite NEGATIVE NEGATIVE   Leukocytes, UA NEGATIVE NEGATIVE  Comprehensive metabolic panel     Status: Abnormal   Collection Time: 07/24/17  2:22 PM  Result Value Ref Range   Sodium 134 (L) 135 - 145 mmol/L   Potassium 4.0 3.5 - 5.1 mmol/L   Chloride 106 101 - 111 mmol/L   CO2 21 (L) 22 - 32 mmol/L   Glucose, Bld 83 65 - 99 mg/dL   BUN 5 (L) 6 - 20 mg/dL   Creatinine, Ser 1.61 (L) 0.44 - 1.00 mg/dL   Calcium 8.9 8.9 - 09.6 mg/dL   Total Protein 7.2 6.5 - 8.1 g/dL   Albumin 3.4 (L) 3.5 - 5.0 g/dL   AST 16 15 - 41 U/L   ALT 11 (L) 14 - 54 U/L   Alkaline Phosphatase 37 (L) 38 - 126 U/L   Total Bilirubin 0.6 0.3 - 1.2 mg/dL   GFR calc non Af Amer >60 >60 mL/min   GFR calc Af Amer >60 >60 mL/min   Anion gap 7 5 - 15    MDM FHT 166 IV headache cocktail ordered -- pt reports improvement in symptoms Assessment and Plan  A: 1. Nausea and vomiting during pregnancy prior to [redacted] weeks gestation   2. [redacted] weeks gestation of pregnancy   3. Migraine without aura and without status migrainosus, not intractable    P: Discharge home Rx reglan & flexeril Discussed reasons to return to MAU Keep scheduled ob appt  Judeth Horn 07/24/2017, 1:50 PM

## 2017-07-24 NOTE — MAU Note (Signed)
Pt presents with c/o diarrhea & vomiting that began @ 0730 this morning.  Pt reports threw up approximately 20 times and had 3 episodes of diarrhea.  States H/A has been constant but worsened @ 0500 this morning.  Reports took Tylenol, no relief.

## 2017-08-05 ENCOUNTER — Encounter: Payer: Self-pay | Admitting: *Deleted

## 2017-08-05 ENCOUNTER — Ambulatory Visit (INDEPENDENT_AMBULATORY_CARE_PROVIDER_SITE_OTHER): Payer: Medicaid Other | Admitting: Obstetrics and Gynecology

## 2017-08-05 ENCOUNTER — Encounter: Payer: Self-pay | Admitting: Obstetrics and Gynecology

## 2017-08-05 DIAGNOSIS — O09891 Supervision of other high risk pregnancies, first trimester: Secondary | ICD-10-CM

## 2017-08-05 DIAGNOSIS — O0990 Supervision of high risk pregnancy, unspecified, unspecified trimester: Secondary | ICD-10-CM

## 2017-08-05 DIAGNOSIS — Z3403 Encounter for supervision of normal first pregnancy, third trimester: Secondary | ICD-10-CM | POA: Insufficient documentation

## 2017-08-05 DIAGNOSIS — Z789 Other specified health status: Secondary | ICD-10-CM | POA: Insufficient documentation

## 2017-08-05 NOTE — Addendum Note (Signed)
Addended by: Berlin BingPICKENS, Hinton Luellen on: 08/05/2017 02:34 PM   Modules accepted: Orders

## 2017-08-05 NOTE — Patient Instructions (Signed)
Take excedrin 1-2 times per week at the most and avoid it in the late 2nd trimester (after 24 weeks).

## 2017-08-05 NOTE — Progress Notes (Signed)
New OB Note  08/05/2017   Clinic: [facility]  Chief Complaint: NOB  Transfer of Care Patient: no  History of Present Illness: [name] is a 22 y.o. G1 @ 11/6 weeks (EDC 7/31, based on  Patient's last menstrual period was 05/14/2017.=11wk u/s).  Preg complicated by has Cochlear implant in place; Otalgia; Headache(784.0); Medication exposure during first trimester of pregnancy; and Supervision of high risk pregnancy, antepartum on their problem list.   Any events prior to today's visit: yes:  Went to have EAB on 12/21 and took po miso and valium but decided against the procedure. Her periods were: regular, qmonth She was using no method when she conceived.  She has Negative signs or symptoms of miscarriage or preterm labor  ROS: A 12-point review of systems was performed and negative, except as stated in the above HPI.  OBGYN History: As per HPI. OB History  Gravida Para Term Preterm AB Living  1 0 0 0 0 0  SAB TAB Ectopic Multiple Live Births  0 0 0 0 0    # Outcome Date GA Lbr Len/2nd Weight Sex Delivery Anes PTL Lv  1 Current              Any issues with any prior pregnancies: not applicable Prior children are healthy, doing well, and without any problems or issues: not applicable History of pap smears: Yes. Last pap smear 2017 and results were NILM   Past Medical History: Past Medical History:  Diagnosis Date  . Cochlear implant in place    age 31  . Exudative pharyngitis 01/01/2013  . FUO (fever of unknown origin) 01/01/2013  . Infection    UTI  . Migraines   . Ovarian cyst    ~6th grade  . Sensorineural hearing loss (SNHL) of both ears    can read lips but unsure about medical terms  . Syncope and collapse 01/01/2013    Past Surgical History: Past Surgical History:  Procedure Laterality Date  . COCHLEAR IMPLANT Right age 22  . FOREIGN BODY REMOVAL Left 10/29/2016   Procedure: EXCISION FOREIGN BODY LEFT ARM;  Surgeon: De Blanch  Kinsinger, MD;  Location: [facility];  Service: General;  Laterality: Left;  . NORPLANT REMOVAL Left 05/15/2016   Procedure: Attempted REMOVAL OF Nexplanon;  Surgeon: Allie Bossier, MD;  Location: [facility];  Service: Gynecology;  Laterality: Left;  Left upper arm.    Family History:  Family History  Problem Relation Age of Onset  . Hypertension Mother   . Hyperlipidemia Father   . Prostate cancer Father   . Pancreatitis Sister   . Pancreatitis Maternal Aunt   . Pancreatitis Maternal Grandmother    She denies any  bleeding or blood clotting disorders.  She denies any history of mental retardation, birth defects or genetic disorders in her or the FOB's history  Social History:  Social History   Socioeconomic History  . Marital status: Single    Spouse name: Not on file  . Number of children: Not on file  . Years of education: Not on file  . Highest education level: Not on file  Social Needs  . Financial resource strain: Not on file  . Food insecurity - worry: Not on file  . Food insecurity - inability: Not on file  . Transportation needs - medical: Not on file  . Transportation needs - non-medical: Not on file  Occupational History  . Not on file  Tobacco Use  .  Smoking status: Never Smoker  . Smokeless tobacco: Never Used  Substance and Sexual Activity  . Alcohol use: Yes    Comment: occasional  . Drug use: No  . Sexual activity: Yes    Birth control/protection: Implant  Other Topics Concern  . Not on file  Social History Narrative  . Not on file    Allergy: No Known Allergies  Health Maintenance:  Mammogram Up to Date: not applicable  Current Outpatient Medications: PNV, prn excedrin  Physical Exam:   BP 114/74   Pulse 95   Wt 135 lb (61.2 kg)   LMP 05/14/2017 Comment: late on LMP, pregnancy test negative  BMI 23.91 kg/m  Body mass index is 23.91 kg/m. Contractions: Not present Vag. Bleeding: None. Fundal height: not applicable FHTs:  160s  General appearance: Well nourished, well developed female in no acute distress.  Neck:  Supple, normal appearance, and no thyromegaly  Cardiovascular: S1, S2 normal, no murmur, rub or gallop, regular rate and rhythm Respiratory:  Clear to auscultation bilateral. Normal respiratory effort Abdomen: positive bowel sounds and no masses, hernias; diffusely non tender to palpation, non distended Breasts: negative s/s  Neuro/Psych:  Normal mood and affect.  Skin:  Warm and dry.  deferred  Laboratory: none  Imaging:  Bedside u/s shows SLIUp at 12/3  Assessment: pt doing well  Plan: 1. Medication exposure during first trimester of pregnancy Recommend 1st trimester screening and f/u anatomy u/s - US MFM Fetal Nuchal Translucency; Future  2. Supervision of high risk pregnancy, antepartum Routine care.  - US MFM Fetal Nuchal Translucency; Future - Babyscripts Schedule Optimization - Obstetric Panel, Including HIV - Culture, OB Urine - GC/Chlamydia probe amp ([facility] - Genetic Screening - Hemoglobinopathy evaluation - Cystic Fibrosis Mutation 97 - SMN1 COPY NUMBER ANALYSIS (SMA Carrier Screen) - US MFM Fetal Nuchal Translucency; Future  3. Migraines Referral to [facility]. excedrin 1-2x/wk at most until 24wks and then d/c Problem list reviewed and updated.  Follow up in 4 weeks.  Interpreter used.   >50% of 30 min visit spent on counseling and coordination of care.     Michele Dixon, [name] MD Attending [facility](Faculty Practice)

## 2017-08-06 ENCOUNTER — Encounter: Payer: Medicaid Other | Admitting: Obstetrics and Gynecology

## 2017-08-07 ENCOUNTER — Encounter (INDEPENDENT_AMBULATORY_CARE_PROVIDER_SITE_OTHER): Payer: Medicaid Other | Admitting: *Deleted

## 2017-08-07 DIAGNOSIS — O09891 Supervision of other high risk pregnancies, first trimester: Secondary | ICD-10-CM

## 2017-08-07 LAB — CULTURE, OB URINE

## 2017-08-07 LAB — URINE CULTURE, OB REFLEX

## 2017-08-08 LAB — HEMOGLOBINOPATHY EVALUATION
HEMOGLOBIN A2 QUANTITATION: 2.3 % (ref 1.8–3.2)
HEMOGLOBIN F QUANTITATION: 0 % (ref 0.0–2.0)
HGB A: 97.7 % (ref 96.4–98.8)
HGB C: 0 %
HGB S: 0 %
HGB VARIANT: 0 %

## 2017-08-08 LAB — OBSTETRIC PANEL, INCLUDING HIV
ANTIBODY SCREEN: NEGATIVE
BASOS ABS: 0 10*3/uL (ref 0.0–0.2)
BASOS: 0 %
EOS (ABSOLUTE): 0.1 10*3/uL (ref 0.0–0.4)
Eos: 1 %
HEMATOCRIT: 35 % (ref 34.0–46.6)
HIV SCREEN 4TH GENERATION: NONREACTIVE
Hemoglobin: 11.5 g/dL (ref 11.1–15.9)
Hepatitis B Surface Ag: NEGATIVE
Immature Grans (Abs): 0 10*3/uL (ref 0.0–0.1)
Immature Granulocytes: 0 %
Lymphocytes Absolute: 1.7 10*3/uL (ref 0.7–3.1)
Lymphs: 21 %
MCH: 29.6 pg (ref 26.6–33.0)
MCHC: 32.9 g/dL (ref 31.5–35.7)
MCV: 90 fL (ref 79–97)
MONOCYTES: 7 %
MONOS ABS: 0.5 10*3/uL (ref 0.1–0.9)
NEUTROS ABS: 5.5 10*3/uL (ref 1.4–7.0)
Neutrophils: 71 %
PLATELETS: 213 10*3/uL (ref 150–379)
RBC: 3.88 x10E6/uL (ref 3.77–5.28)
RDW: 13.6 % (ref 12.3–15.4)
RPR Ser Ql: NONREACTIVE
Rh Factor: POSITIVE
Rubella Antibodies, IGG: 2.47 index (ref >0.99)
WBC: 7.9 10*3/uL (ref 3.4–10.8)

## 2017-08-11 ENCOUNTER — Encounter: Payer: Self-pay | Admitting: *Deleted

## 2017-08-11 LAB — SMN1 COPY NUMBER ANALYSIS (SMA CARRIER SCREENING)

## 2017-08-11 LAB — CYSTIC FIBROSIS MUTATION 97: Interpretation: NOT DETECTED

## 2017-08-12 ENCOUNTER — Ambulatory Visit (HOSPITAL_COMMUNITY): Admission: RE | Admit: 2017-08-12 | Payer: Medicaid Other | Source: Ambulatory Visit

## 2017-08-12 ENCOUNTER — Ambulatory Visit (HOSPITAL_COMMUNITY)
Admission: RE | Admit: 2017-08-12 | Discharge: 2017-08-12 | Disposition: A | Discharge status: Home / Self Care | Payer: Medicaid Other | Source: Ambulatory Visit | Attending: Obstetrics and Gynecology | Admitting: Obstetrics and Gynecology

## 2017-08-12 ENCOUNTER — Other Ambulatory Visit: Payer: Self-pay | Admitting: Obstetrics and Gynecology

## 2017-08-12 ENCOUNTER — Encounter (HOSPITAL_COMMUNITY): Payer: Self-pay

## 2017-08-12 DIAGNOSIS — O0990 Supervision of high risk pregnancy, unspecified, unspecified trimester: Secondary | ICD-10-CM

## 2017-08-12 DIAGNOSIS — O09891 Supervision of other high risk pregnancies, first trimester: Secondary | ICD-10-CM

## 2017-08-12 DIAGNOSIS — Z3A12 12 weeks gestation of pregnancy: Secondary | ICD-10-CM | POA: Insufficient documentation

## 2017-08-12 NOTE — Addendum Note (Signed)
Encounter addended by: Drue NovelKeatts, Yarlin Breisch J, RDMS on: 08/12/2017 8:46 AM  Actions taken: Imaging Exam ended

## 2017-08-20 ENCOUNTER — Encounter: Payer: Self-pay | Admitting: *Deleted

## 2017-09-02 ENCOUNTER — Other Ambulatory Visit (HOSPITAL_COMMUNITY)
Admission: RE | Admit: 2017-09-02 | Discharge: 2017-09-02 | Disposition: A | Discharge status: Home / Self Care | Payer: Medicaid Other | Source: Ambulatory Visit | Attending: Family Medicine | Admitting: Family Medicine

## 2017-09-02 ENCOUNTER — Ambulatory Visit (INDEPENDENT_AMBULATORY_CARE_PROVIDER_SITE_OTHER): Payer: Medicaid Other | Admitting: Family Medicine

## 2017-09-02 VITALS — BP 112/79 | HR 92 | Wt 138.0 lb

## 2017-09-02 DIAGNOSIS — O0992 Supervision of high risk pregnancy, unspecified, second trimester: Secondary | ICD-10-CM | POA: Insufficient documentation

## 2017-09-02 DIAGNOSIS — Z3A15 15 weeks gestation of pregnancy: Secondary | ICD-10-CM | POA: Insufficient documentation

## 2017-09-02 DIAGNOSIS — K5901 Slow transit constipation: Secondary | ICD-10-CM

## 2017-09-02 DIAGNOSIS — Z23 Encounter for immunization: Secondary | ICD-10-CM | POA: Diagnosis not present

## 2017-09-02 DIAGNOSIS — O0990 Supervision of high risk pregnancy, unspecified, unspecified trimester: Secondary | ICD-10-CM | POA: Diagnosis present

## 2017-09-02 MED ORDER — DOCUSATE SODIUM 100 MG PO CAPS: 100.0000 mg | ORAL_CAPSULE | Freq: Two times a day (BID) | ORAL | 2 refills | Status: DC | PRN

## 2017-09-02 NOTE — Progress Notes (Signed)
  Discuss prenatal vitamin

## 2017-09-02 NOTE — Progress Notes (Signed)
   PRENATAL VISIT NOTE  Subjective:  Michele Dixon is a 22 y.o. G1P0000 at 10476w6d being seen today for ongoing prenatal care.  She is currently monitored for the following issues for this low-risk pregnancy and has Cochlear implant in place; Otalgia; Medication exposure during first trimester of pregnancy; Supervision of high risk pregnancy, antepartum; Language barrier; Bilateral sensorineural hearing loss; Congenital anomaly of ear ossicles; and Migraine headache on their problem list.  Patient reports constipation.  Contractions: Not present.  .  Movement: Absent. Denies leaking of fluid.   The following portions of the patient's history were reviewed and updated as appropriate: allergies, current medications, past family history, past medical history, past social history, past surgical history and problem list. Problem list updated.  Objective:   Vitals:   09/02/17 0844  BP: 112/79  Pulse: 92  Weight: 138 lb (62.6 kg)    Fetal Status: Fetal Heart Rate (bpm): 151   Movement: Absent     General:  Alert, oriented and cooperative. Patient is in no acute distress.  Skin: Skin is warm and dry. No rash noted.   Cardiovascular: Normal heart rate noted  Respiratory: Normal respiratory effort, no problems with respiration noted  Abdomen: Soft, gravid, appropriate for gestational age.  Pain/Pressure: Present     Pelvic: Cervical exam deferred        Extremities: Normal range of motion.  Edema: None  Mental Status:  Normal mood and affect. Normal behavior. Normal judgment and thought content.   Assessment and Plan:  Pregnancy: G1P0000 at 6676w6d  1. Supervision of high risk pregnancy, antepartum Synced her baby Scripts - AFP, Serum, Open Spina Bifida - Flu Vaccine QUAD 36+ mos IM - Urine cytology ancillary only  2. Slow transit constipation Trial of colace - docusate sodium (COLACE) 100 MG capsule; Take 1 capsule (100 mg total) by mouth 2 (two) times daily as needed.  Dispense: 30  capsule; Refill: 2  General obstetric precautions including but not limited to vaginal bleeding, contractions, leaking of fluid and fetal movement were reviewed in detail with the patient. Please refer to After Visit Summary for other counseling recommendations.  No Follow-up on file.   Reva Boresanya S Jadasia Haws, MD

## 2017-09-02 NOTE — Patient Instructions (Signed)

## 2017-09-02 NOTE — Progress Notes (Signed)
Activated BRX BP cuff today

## 2017-09-03 LAB — URINE CYTOLOGY ANCILLARY ONLY
Chlamydia: NEGATIVE
Neisseria Gonorrhea: NEGATIVE

## 2017-09-05 LAB — AFP, SERUM, OPEN SPINA BIFIDA
AFP MOM: 0.93
AFP VALUE AFPOSL: 34.4 ng/mL
GEST. AGE ON COLLECTION DATE: 15.9 wk
Maternal Age At EDD: 24.3 yr
OSBR RISK 1 IN: 10000
Test Results:: NEGATIVE
WEIGHT: 135 [lb_av]

## 2017-09-23 ENCOUNTER — Encounter (HOSPITAL_COMMUNITY): Payer: Self-pay

## 2017-09-23 ENCOUNTER — Other Ambulatory Visit (HOSPITAL_COMMUNITY): Payer: Self-pay | Admitting: *Deleted

## 2017-09-23 ENCOUNTER — Ambulatory Visit (HOSPITAL_COMMUNITY)
Admission: RE | Admit: 2017-09-23 | Discharge: 2017-09-23 | Disposition: A | Discharge status: Home / Self Care | Payer: Medicaid Other | Source: Ambulatory Visit | Attending: Obstetrics and Gynecology | Admitting: Obstetrics and Gynecology

## 2017-09-23 ENCOUNTER — Other Ambulatory Visit: Payer: Self-pay | Admitting: Obstetrics and Gynecology

## 2017-09-23 DIAGNOSIS — Z3A18 18 weeks gestation of pregnancy: Secondary | ICD-10-CM | POA: Diagnosis not present

## 2017-09-23 DIAGNOSIS — Z3689 Encounter for other specified antenatal screening: Secondary | ICD-10-CM

## 2017-09-23 DIAGNOSIS — O09891 Supervision of other high risk pregnancies, first trimester: Secondary | ICD-10-CM

## 2017-09-23 DIAGNOSIS — Z362 Encounter for other antenatal screening follow-up: Secondary | ICD-10-CM

## 2017-09-23 DIAGNOSIS — O09892 Supervision of other high risk pregnancies, second trimester: Secondary | ICD-10-CM | POA: Insufficient documentation

## 2017-09-30 ENCOUNTER — Encounter: Payer: Self-pay | Admitting: Obstetrics and Gynecology

## 2017-09-30 ENCOUNTER — Ambulatory Visit (INDEPENDENT_AMBULATORY_CARE_PROVIDER_SITE_OTHER): Payer: Medicaid Other | Admitting: Obstetrics and Gynecology

## 2017-09-30 VITALS — BP 131/66 | HR 99 | Wt 141.0 lb

## 2017-09-30 DIAGNOSIS — O0990 Supervision of high risk pregnancy, unspecified, unspecified trimester: Secondary | ICD-10-CM

## 2017-09-30 DIAGNOSIS — Z789 Other specified health status: Secondary | ICD-10-CM

## 2017-09-30 NOTE — Progress Notes (Signed)
Prenatal Visit Note Date: 09/30/2017 [facility]  Subjective:  Michele Dixon is a 22 y.o. G1P0000 at 6866w6d being seen today for ongoing prenatal care.  She is currently monitored for the following issues for this low-risk pregnancy and has Cochlear implant in place; Otalgia; Medication exposure during first trimester of pregnancy; Supervision of high risk pregnancy, antepartum; Language barrier; Bilateral sensorineural hearing loss; Congenital anomaly of ear ossicles; and Migraine headache on their problem list.  Patient reports no complaints.   Contractions: Not present. Vag. Bleeding: None.  Movement: Present. Denies leaking of fluid.   The following portions of the patient's history were reviewed and updated as appropriate: allergies, current medications, past family history, past medical history, past social history, past surgical history and problem list. Problem list updated.  Objective:   Vitals:   09/30/17 0832  BP: 131/66  Pulse: 99  Weight: 141 lb (64 kg)    Fetal Status: Fetal Heart Rate (bpm): 151   Movement: Present     General:  Alert, oriented and cooperative. Patient is in no acute distress.  Skin: Skin is warm and dry. No rash noted.   Cardiovascular: Normal heart rate noted  Respiratory: Normal respiratory effort, no problems with respiration noted  Abdomen: Soft, gravid, appropriate for gestational age. Pain/Pressure: Present     Pelvic:  Cervical exam deferred        Extremities: Normal range of motion.  Edema: None  Mental Status: Normal mood and affect. Normal behavior. Normal judgment and thought content.   Urinalysis:      Assessment and Plan:  Pregnancy: G1P0000 at 5966w6d  1. Supervision of high risk pregnancy, antepartum Routine care. 28wk labs nv. F/u completion anatomy u/s  2. Language barrier Interpreter used  Preterm labor symptoms and general obstetric precautions including but not limited to vaginal bleeding,  contractions, leaking of fluid and fetal movement were reviewed in detail with the patient. Please refer to After Visit Summary for other counseling recommendations.  Return for per baby scripts.   [place] BingPickens, Michele Dioguardi, MD

## 2017-10-21 ENCOUNTER — Other Ambulatory Visit (HOSPITAL_COMMUNITY): Payer: Self-pay | Admitting: Obstetrics and Gynecology

## 2017-10-21 ENCOUNTER — Ambulatory Visit (HOSPITAL_COMMUNITY)
Admission: RE | Admit: 2017-10-21 | Discharge: 2017-10-21 | Disposition: A | Discharge status: Home / Self Care | Payer: Medicaid Other | Source: Ambulatory Visit | Attending: Obstetrics and Gynecology | Admitting: Obstetrics and Gynecology

## 2017-10-21 ENCOUNTER — Encounter (HOSPITAL_COMMUNITY): Payer: Self-pay

## 2017-10-21 DIAGNOSIS — Z362 Encounter for other antenatal screening follow-up: Secondary | ICD-10-CM | POA: Diagnosis not present

## 2017-10-21 DIAGNOSIS — O09892 Supervision of other high risk pregnancies, second trimester: Secondary | ICD-10-CM | POA: Diagnosis not present

## 2017-10-21 DIAGNOSIS — Z3A22 22 weeks gestation of pregnancy: Secondary | ICD-10-CM

## 2017-10-21 DIAGNOSIS — O09891 Supervision of other high risk pregnancies, first trimester: Secondary | ICD-10-CM

## 2017-10-29 ENCOUNTER — Ambulatory Visit (INDEPENDENT_AMBULATORY_CARE_PROVIDER_SITE_OTHER): Payer: Medicaid Other | Admitting: Obstetrics and Gynecology

## 2017-10-29 VITALS — BP 102/70 | HR 70 | Wt 144.0 lb

## 2017-10-29 DIAGNOSIS — O0990 Supervision of high risk pregnancy, unspecified, unspecified trimester: Secondary | ICD-10-CM

## 2017-10-29 DIAGNOSIS — O0992 Supervision of high risk pregnancy, unspecified, second trimester: Secondary | ICD-10-CM

## 2017-10-29 DIAGNOSIS — Z3482 Encounter for supervision of other normal pregnancy, second trimester: Secondary | ICD-10-CM

## 2017-10-29 DIAGNOSIS — Z789 Other specified health status: Secondary | ICD-10-CM

## 2017-10-29 NOTE — Progress Notes (Signed)
Prenatal Visit Note Date: 10/29/2017 Clinic: [facility]  Subjective:  Michele Dixon is a 22 y.o. G1P0000 at 3080w0d being seen today for ongoing prenatal care.  She is currently monitored for the following issues for this high-risk pregnancy and has Cochlear implant in place; Otalgia; Medication exposure during first trimester of pregnancy; Supervision of high risk pregnancy, antepartum; Language barrier; Bilateral sensorineural hearing loss; Congenital anomaly of ear ossicles; and Migraine headache on their problem list.  Patient reports no complaints.   Contractions: Not present.  .  Movement: Present. Denies leaking of fluid.   The following portions of the patient's history were reviewed and updated as appropriate: allergies, current medications, past family history, past medical history, past social history, past surgical history and problem list. Problem list updated.  Objective:   Vitals:   10/29/17 0840  BP: 102/70  Pulse: 70  Weight: 144 lb (65.3 kg)    Fetal Status: Fetal Heart Rate (bpm): 143   Movement: Present     General:  Alert, oriented and cooperative. Patient is in no acute distress.  Skin: Skin is warm and dry. No rash noted.   Cardiovascular: Normal heart rate noted  Respiratory: Normal respiratory effort, no problems with respiration noted  Abdomen: Soft, gravid, appropriate for gestational age. Pain/Pressure: Present     Pelvic:  Cervical exam deferred        Extremities: Normal range of motion.  Edema: None  Mental Status: Normal mood and affect. Normal behavior. Normal judgment and thought content.   Urinalysis:      Assessment and Plan:  Pregnancy: G1P0000 at 8080w0d  1. Encounter for supervision of other normal pregnancy in second trimester Routine care. D/w her to try and shoot for close to 160 end weight by end of pregnancy - RPR - HIV antibody - CBC - Glucose Tolerance, 2 Hours w/1 Hour  2. Supervision of high risk pregnancy,  antepartum  3. Language barrier Sign lang interpreter used  Preterm labor symptoms and general obstetric precautions including but not limited to vaginal bleeding, contractions, leaking of fluid and fetal movement were reviewed in detail with the patient. Please refer to After Visit Summary for other counseling recommendations.  Return in about 1 month (around 11/26/2017) for rob.   Dodson BingPickens, Valeda Corzine, MD

## 2017-10-30 LAB — HIV ANTIBODY (ROUTINE TESTING W REFLEX): HIV SCREEN 4TH GENERATION: NONREACTIVE

## 2017-10-30 LAB — CBC
HEMATOCRIT: 31.2 % — AB (ref 34.0–46.6)
HEMOGLOBIN: 10.2 g/dL — AB (ref 11.1–15.9)
MCH: 30.4 pg (ref 26.6–33.0)
MCHC: 32.7 g/dL (ref 31.5–35.7)
MCV: 93 fL (ref 79–97)
Platelets: 194 10*3/uL (ref 150–379)
RBC: 3.36 x10E6/uL — AB (ref 3.77–5.28)
RDW: 13.7 % (ref 12.3–15.4)
WBC: 8 10*3/uL (ref 3.4–10.8)

## 2017-10-30 LAB — RPR: RPR: NONREACTIVE

## 2017-10-30 LAB — GLUCOSE TOLERANCE, 2 HOURS W/ 1HR
GLUCOSE, 1 HOUR: 69 mg/dL (ref 65–179)
GLUCOSE, FASTING: 67 mg/dL (ref 65–91)
Glucose, 2 hour: 60 mg/dL — ABNORMAL LOW (ref 65–152)

## 2017-11-26 ENCOUNTER — Ambulatory Visit (INDEPENDENT_AMBULATORY_CARE_PROVIDER_SITE_OTHER): Payer: Medicaid Other | Admitting: Obstetrics and Gynecology

## 2017-11-26 VITALS — BP 112/75 | HR 70 | Wt 148.0 lb

## 2017-11-26 DIAGNOSIS — Z789 Other specified health status: Secondary | ICD-10-CM

## 2017-11-26 DIAGNOSIS — O0990 Supervision of high risk pregnancy, unspecified, unspecified trimester: Secondary | ICD-10-CM

## 2017-11-26 NOTE — Progress Notes (Signed)
Prenatal Visit Note Date: 11/26/2017 Clinic: [facility]  Subjective:  Michele Dixon is a 22 y.o. G1P0000 at [redacted]w[redacted]d being seen today for ongoing prenatal care.  She is currently monitored for the following issues for this high-risk pregnancy and has Cochlear implant in place; Otalgia; Medication exposure during first trimester of pregnancy; Supervision of high risk pregnancy, antepartum; Language barrier; Bilateral sensorineural hearing loss; Congenital anomaly of ear ossicles; and Migraine headache on their problem list.  Patient reports no complaints.   Contractions: Not present. Vag. Bleeding: None.  Movement: Present. Denies leaking of fluid.   The following portions of the patient's history were reviewed and updated as appropriate: allergies, current medications, past family history, past medical history, past social history, past surgical history and problem list. Problem list updated.  Objective:   Vitals:   11/26/17 1015  BP: 112/75  Pulse: 70  Weight: 148 lb (67.1 kg)    Fetal Status: Fetal Heart Rate (bpm): 145 Fundal Height: 29 cm Movement: Present     General:  Alert, oriented and cooperative. Patient is in no acute distress.  Skin: Skin is warm and dry. No rash noted.   Cardiovascular: Normal heart rate noted  Respiratory: Normal respiratory effort, no problems with respiration noted  Abdomen: Soft, gravid, appropriate for gestational age. Pain/Pressure: Present     Pelvic:  Cervical exam deferred        Extremities: Normal range of motion.  Edema: None  Mental Status: Normal mood and affect. Normal behavior. Normal judgment and thought content.   Urinalysis:      Assessment and Plan:  Pregnancy: G1P0000 at [redacted]w[redacted]d  1. Supervision of high risk pregnancy, antepartum Routine care. Pt already taking an otc iron. Recommend she take it 2x/day.   2. Language barrier Interpreter not available. We typed everything out and all questions asked and  answered.   Preterm labor symptoms and general obstetric precautions including but not limited to vaginal bleeding, contractions, leaking of fluid and fetal movement were reviewed in detail with the patient. Please refer to After Visit Summary for other counseling recommendations.  4wks per babyscripts    Bing, MD

## 2017-12-24 ENCOUNTER — Encounter: Payer: Self-pay | Admitting: Obstetrics and Gynecology

## 2017-12-24 ENCOUNTER — Ambulatory Visit (INDEPENDENT_AMBULATORY_CARE_PROVIDER_SITE_OTHER): Payer: Medicaid Other | Admitting: Obstetrics and Gynecology

## 2017-12-24 VITALS — BP 102/69 | HR 70 | Wt 155.0 lb

## 2017-12-24 DIAGNOSIS — O0990 Supervision of high risk pregnancy, unspecified, unspecified trimester: Secondary | ICD-10-CM

## 2017-12-24 NOTE — Progress Notes (Signed)
   PRENATAL VISIT NOTE  Subjective:  Michele Dixon is a 22 y.o. G1P0000 at 3966w0d being seen today for ongoing prenatal care.  She is currently monitored for the following issues for this high-risk pregnancy and has Cochlear implant in place; Otalgia; Medication exposure during first trimester of pregnancy; Supervision of high risk pregnancy, antepartum; Language barrier; Bilateral sensorineural hearing loss; Congenital anomaly of ear ossicles; and Migraine headache on their problem list.  Patient reports no complaints.  Contractions: Not present.  .  Movement: Present. Denies leaking of fluid.   The following portions of the patient's history were reviewed and updated as appropriate: allergies, current medications, past family history, past medical history, past social history, past surgical history and problem list. Problem list updated.  Objective:   Vitals:   12/24/17 0905  BP: 102/69  Pulse: 70  Weight: 155 lb (70.3 kg)    Fetal Status: Fetal Heart Rate (bpm): 132 Fundal Height: 32 cm Movement: Present     General:  Alert, oriented and cooperative. Patient is in no acute distress.  Skin: Skin is warm and dry. No rash noted.   Cardiovascular: Normal heart rate noted  Respiratory: Normal respiratory effort, no problems with respiration noted  Abdomen: Soft, gravid, appropriate for gestational age.  Pain/Pressure: Present     Pelvic: Cervical exam deferred        Extremities: Normal range of motion.  Edema: None  Mental Status: Normal mood and affect. Normal behavior. Normal judgment and thought content.   Assessment and Plan:  Pregnancy: G1P0000 at 2666w0d  1. Supervision of high risk pregnancy, antepartum Patient is doing well without complaints Cultures next visit Patient strongly desires BTL- consent signed today Patient researching pediatricians Patient desires outpatient circumcision  Preterm labor symptoms and general obstetric precautions including but not limited to  vaginal bleeding, contractions, leaking of fluid and fetal movement were reviewed in detail with the patient. Please refer to After Visit Summary for other counseling recommendations.  Return in about 1 month (around 01/21/2018) for ROB.  No future appointments.  Catalina AntiguaPeggy Aniston Christman, MD

## 2018-01-21 ENCOUNTER — Ambulatory Visit (INDEPENDENT_AMBULATORY_CARE_PROVIDER_SITE_OTHER): Payer: Medicaid Other | Admitting: Obstetrics & Gynecology

## 2018-01-21 ENCOUNTER — Other Ambulatory Visit (HOSPITAL_COMMUNITY)
Admission: RE | Admit: 2018-01-21 | Discharge: 2018-01-21 | Disposition: A | Discharge status: Home / Self Care | Payer: Medicaid Other | Source: Ambulatory Visit | Attending: Obstetrics & Gynecology | Admitting: Obstetrics & Gynecology

## 2018-01-21 VITALS — BP 119/78 | HR 96 | Wt 160.6 lb

## 2018-01-21 DIAGNOSIS — O0990 Supervision of high risk pregnancy, unspecified, unspecified trimester: Secondary | ICD-10-CM | POA: Diagnosis present

## 2018-01-21 DIAGNOSIS — O0993 Supervision of high risk pregnancy, unspecified, third trimester: Secondary | ICD-10-CM | POA: Insufficient documentation

## 2018-01-21 DIAGNOSIS — Z23 Encounter for immunization: Secondary | ICD-10-CM

## 2018-01-21 DIAGNOSIS — Z3A36 36 weeks gestation of pregnancy: Secondary | ICD-10-CM | POA: Insufficient documentation

## 2018-01-21 DIAGNOSIS — Z789 Other specified health status: Secondary | ICD-10-CM

## 2018-01-21 DIAGNOSIS — Z3009 Encounter for other general counseling and advice on contraception: Secondary | ICD-10-CM

## 2018-01-21 LAB — OB RESULTS CONSOLE GBS: GBS: NEGATIVE

## 2018-01-21 LAB — OB RESULTS CONSOLE GC/CHLAMYDIA: GC PROBE AMP, GENITAL: NEGATIVE

## 2018-01-21 NOTE — Progress Notes (Signed)
   PRENATAL VISIT NOTE  Subjective:  Michele Dixon is a 22 y.o. G1P0000 at 3830w0d being seen today for ongoing prenatal care.  She is currently monitored for the following issues for this low-risk pregnancy and has Cochlear implant in place; Otalgia; Medication exposure during first trimester of pregnancy; Supervision of high risk pregnancy, antepartum; Language barrier; Bilateral sensorineural hearing loss; Congenital anomaly of ear ossicles; and Migraine headache on their problem list.  Patient reports no complaints.  Contractions: Irregular. Vag. Bleeding: None.  Movement: Present. Denies leaking of fluid.   The following portions of the patient's history were reviewed and updated as appropriate: allergies, current medications, past family history, past medical history, past social history, past surgical history and problem list. Problem list updated.  Objective:   Vitals:   01/21/18 0820  BP: 119/78  Pulse: 96  Weight: 160 lb 9.6 oz (72.8 kg)    Fetal Status: Fetal Heart Rate (bpm): 140   Movement: Present     General:  Alert, oriented and cooperative. Patient is in no acute distress.  Skin: Skin is warm and dry. No rash noted.   Cardiovascular: Normal heart rate noted  Respiratory: Normal respiratory effort, no problems with respiration noted  Abdomen: Soft, gravid, appropriate for gestational age.  Pain/Pressure: Present     Pelvic: Cervical exam performed        Extremities: Normal range of motion.  Edema: None  Mental Status: Normal mood and affect. Normal behavior. Normal judgment and thought content.   Assessment and Plan:  Pregnancy: G1P0000 at 930w0d  1. Supervision of high risk pregnancy, antepartum  - Culture, beta strep (group b only) - Cervicovaginal ancillary only  2. Language barrier   Preterm labor symptoms and general obstetric precautions including but not limited to vaginal bleeding, contractions, leaking of fluid and fetal movement were reviewed in  detail with the patient. Please refer to After Visit Summary for other counseling recommendations.  Return in about 1 week (around 01/28/2018).  No future appointments.  Allie BossierMyra C Dianna Deshler, MD

## 2018-01-21 NOTE — Progress Notes (Signed)
   PRENATAL VISIT NOTE  Subjective:  Michele Dixon is a 22 y.o. G1P0000 at 1950w0d being seen today for ongoing prenatal care.  She is currently monitored for the following issues for this low-risk pregnancy and has Cochlear implant in place; Otalgia; Medication exposure during first trimester of pregnancy; Supervision of high risk pregnancy, antepartum; Language barrier; Bilateral sensorineural hearing loss; Congenital anomaly of ear ossicles; and Migraine headache on their problem list.  Patient reports no complaints.   .  .   . Denies leaking of fluid.   The following portions of the patient's history were reviewed and updated as appropriate: allergies, current medications, past family history, past medical history, past social history, past surgical history and problem list. Problem list updated.  Objective:  There were no vitals filed for this visit.  Fetal Status:           General:  Alert, oriented and cooperative. Patient is in no acute distress.  Skin: Skin is warm and dry. No rash noted.   Cardiovascular: Normal heart rate noted  Respiratory: Normal respiratory effort, no problems with respiration noted  Abdomen: Soft, gravid, appropriate for gestational age.        Pelvic: Cervical exam performed        Extremities: Normal range of motion.     Mental Status: Normal mood and affect. Normal behavior. Normal judgment and thought content.   Assessment and Plan:  Pregnancy: G1P0000 at 2950w0d  1. Supervision of high risk pregnancy, antepartum-  - Culture, beta strep (group b only) - Cervicovaginal ancillary only - sign Medicaid forms for BTL  2. Language barrier - ASL interpreter present  Preterm labor symptoms and general obstetric precautions including but not limited to vaginal bleeding, contractions, leaking of fluid and fetal movement were reviewed in detail with the patient. Please refer to After Visit Summary for other counseling recommendations.  Return in about 1 week  (around 01/28/2018).  No future appointments.  Allie BossierMyra C Trice Aspinall, MD

## 2018-01-22 LAB — CERVICOVAGINAL ANCILLARY ONLY
CHLAMYDIA, DNA PROBE: NEGATIVE
Neisseria Gonorrhea: NEGATIVE

## 2018-01-24 LAB — CULTURE, BETA STREP (GROUP B ONLY): STREP GP B CULTURE: NEGATIVE

## 2018-01-29 ENCOUNTER — Telehealth: Payer: Self-pay

## 2018-01-29 ENCOUNTER — Inpatient Hospital Stay (HOSPITAL_COMMUNITY)
Admission: AD | Admit: 2018-01-29 | Discharge: 2018-01-29 | Disposition: A | Discharge status: Home / Self Care | Payer: Medicaid Other | Source: Ambulatory Visit | Attending: Obstetrics and Gynecology | Admitting: Obstetrics and Gynecology

## 2018-01-29 ENCOUNTER — Encounter (HOSPITAL_COMMUNITY): Payer: Self-pay | Admitting: *Deleted

## 2018-01-29 DIAGNOSIS — Z3483 Encounter for supervision of other normal pregnancy, third trimester: Secondary | ICD-10-CM | POA: Insufficient documentation

## 2018-01-29 DIAGNOSIS — Z3009 Encounter for other general counseling and advice on contraception: Secondary | ICD-10-CM

## 2018-01-29 DIAGNOSIS — O479 False labor, unspecified: Secondary | ICD-10-CM

## 2018-01-29 DIAGNOSIS — Z3A38 38 weeks gestation of pregnancy: Secondary | ICD-10-CM | POA: Insufficient documentation

## 2018-01-29 NOTE — MAU Note (Signed)
Pt reports she stared having add and back pain and cramping last night at work . Cam home still was having some pressure reports a little blood and some small drips of water . Today still some pressure not like last night.

## 2018-01-29 NOTE — Telephone Encounter (Signed)
Received call from patient using sign language interpreter. Patient having sharp pains since last night and reports having some leaking fluids. She has started to wear a pad since this happened. Patient is unsure of what to do but has a lot of pain. I have advised patient to go directly to MAU to be seen since we do not have a provider that can see her at this time. Patient voice understanding at this time.

## 2018-01-29 NOTE — MAU Note (Signed)
I have communicated with Dr. Doroteo GlassmanPhelps and reviewed vital signs:  Vitals:   01/29/18 1603  BP: 125/78  Pulse: (!) 106  Resp: 18  Temp: 98.8 F (37.1 C)    Vaginal exam:  Dilation: Closed Effacement (%): 40 Cervical Position: Middle Station: -3 Presentation: Vertex Exam by:: K.Etai Copado,RN,   Also reviewed contraction pattern and that non-stress test is reactive.  It has been documented that patient is not contracting with some mild Uterine irritability with no cervical dialation not indicating active labor.  Patient denies any other complaints.  Based on this report provider has given order for discharge.  A discharge order and diagnosis entered by a provider.   Labor discharge instructions reviewed with patient.

## 2018-02-02 ENCOUNTER — Ambulatory Visit (INDEPENDENT_AMBULATORY_CARE_PROVIDER_SITE_OTHER): Payer: Medicaid Other | Admitting: Obstetrics & Gynecology

## 2018-02-02 VITALS — BP 101/72 | HR 72 | Wt 169.5 lb

## 2018-02-02 DIAGNOSIS — L0232 Furuncle of buttock: Secondary | ICD-10-CM

## 2018-02-02 DIAGNOSIS — Z3403 Encounter for supervision of normal first pregnancy, third trimester: Secondary | ICD-10-CM

## 2018-02-02 MED ORDER — CLINDAMYCIN HCL 300 MG PO CAPS: 300.0000 mg | ORAL_CAPSULE | Freq: Three times a day (TID) | ORAL | 0 refills | Status: DC

## 2018-02-02 NOTE — Patient Instructions (Signed)
Return to clinic for any scheduled appointments or obstetric concerns, or go to MAU for evaluation  

## 2018-02-02 NOTE — Progress Notes (Signed)
   PRENATAL VISIT NOTE  Subjective:  Michele Dixon is a 22 y.o. G1P0000 at 2822w5d being seen today for ongoing prenatal care.  She is currently monitored for the following issues for this low-risk pregnancy and has Cochlear implant in place; Otalgia; Medication exposure during first trimester of pregnancy; Encounter for supervision of normal first pregnancy in third trimester; Language barrier; Bilateral sensorineural hearing loss; Congenital anomaly of ear ossicles; Migraine headache; and Unwanted fertility on their problem list.  Patient reports boil on her buttock since x 2 days.  Contractions: Not present.  .  Movement: Present. Denies leaking of fluid.   The following portions of the patient's history were reviewed and updated as appropriate: allergies, current medications, past family history, past medical history, past social history, past surgical history and problem list. Problem list updated.  Objective:   Vitals:   02/02/18 1317  BP: 101/72  Pulse: 72  Weight: 169 lb 8 oz (76.9 kg)    Fetal Status: Fetal Heart Rate (bpm): 129 Fundal Height: 37 cm Movement: Present     General:  Alert, oriented and cooperative. Patient is in no acute distress.  Skin: Skin is warm and dry. No rash noted.   Cardiovascular: Normal heart rate noted  Respiratory: Normal respiratory effort, no problems with respiration noted  Abdomen: Soft, gravid, appropriate for gestational age.  Pain/Pressure: Present     Pelvic: Cervical exam deferred       Boil noted on inner inferior, inner part of R buttock with pus noticed, and surrounding induration, mild erythema, tender to touch  Extremities: Normal range of motion.  Edema: None  Mental Status: Normal mood and affect. Normal behavior. Normal judgment and thought content.   Physical Exam  Genitourinary:       Incision and Drainage Patient was examined in the dorsal lithotomy position and mass was identified.  The area was prepped with Iodine and  draped in a sterile manner. Topical lidocaine was then applied to the top of the abscess.  A 3 mm incision was made using a sterile scapel. Upon palpation of the mass, a moderate amount of bloody purulent drainage was expressed through the incision. Samples of the drainage were sent for cultures.   Patient tolerated the procedure well, reported feeling " a lot better." - Clindamycin 300 mg po tid x 7 days prescribed for treatment  Assessment and Plan:  Pregnancy: G1P0000 at 7922w5d  1. Boil of buttock Incised and drained today - Wound culture - clindamycin (CLEOCIN) 300 MG capsule; Take 1 capsule (300 mg total) by mouth 3 (three) times daily.  Dispense: 21 capsule; Refill: 0 - Recommended Sitz baths bid and Tylenol prn pain.   She was told to call to be examined if she experiences increasing swelling, pain, vaginal discharge, or fever.  - She was instructed to wear a peripad to absorb discharge . 2. Encounter for supervision of normal first pregnancy in third trimester Preterm labor symptoms and general obstetric precautions including but not limited to vaginal bleeding, contractions, leaking of fluid and fetal movement were reviewed in detail with the patient. Please refer to After Visit Summary for other counseling recommendations.  Return in about 1 week (around 02/09/2018) for OB 39 week visit (Babyscripts).  Jaynie CollinsUgonna Blaike Vickers, MD

## 2018-02-04 ENCOUNTER — Encounter: Payer: Medicaid Other | Admitting: Family Medicine

## 2018-02-04 LAB — WOUND CULTURE

## 2018-02-09 ENCOUNTER — Encounter: Payer: Medicaid Other | Admitting: Family Medicine

## 2018-02-11 ENCOUNTER — Ambulatory Visit (INDEPENDENT_AMBULATORY_CARE_PROVIDER_SITE_OTHER): Payer: Medicaid Other | Admitting: Obstetrics & Gynecology

## 2018-02-11 VITALS — BP 118/85 | HR 72 | Wt 166.4 lb

## 2018-02-11 DIAGNOSIS — Z3403 Encounter for supervision of normal first pregnancy, third trimester: Secondary | ICD-10-CM

## 2018-02-11 DIAGNOSIS — Z3009 Encounter for other general counseling and advice on contraception: Secondary | ICD-10-CM

## 2018-02-11 DIAGNOSIS — Z789 Other specified health status: Secondary | ICD-10-CM

## 2018-02-11 NOTE — Progress Notes (Signed)
   PRENATAL VISIT NOTE  Subjective:  Michele Dixon is a 22 y.o. G1P0000 at 7431w0d being seen today for ongoing prenatal care.  She is currently monitored for the following issues for this high-risk pregnancy and has Cochlear implant in place; Otalgia; Medication exposure during first trimester of pregnancy; Encounter for supervision of normal first pregnancy in third trimester; Language barrier; Bilateral sensorineural hearing loss; Congenital anomaly of ear ossicles; Migraine headache; and Unwanted fertility on their problem list.  Patient reports no complaints.   .  .   . Denies leaking of fluid.   The following portions of the patient's history were reviewed and updated as appropriate: allergies, current medications, past family history, past medical history, past social history, past surgical history and problem list. Problem list updated.  Objective:  There were no vitals filed for this visit.  Fetal Status:           General:  Alert, oriented and cooperative. Patient is in no acute distress.  Skin: Skin is warm and dry. No rash noted.   Cardiovascular: Normal heart rate noted  Respiratory: Normal respiratory effort, no problems with respiration noted  Abdomen: Soft, gravid, appropriate for gestational age.        Pelvic: Cervical exam performed        Extremities: Normal range of motion.     Mental Status: Normal mood and affect. Normal behavior. Normal judgment and thought content.   Assessment and Plan:  Pregnancy: G1P0000 at 5931w0d  1. Encounter for supervision of normal first pregnancy in third trimester   2. Unwanted fertility - plans BTL, signed medicaid forms  3. Language barrier - ASL interpretor present  Term labor symptoms and general obstetric precautions including but not limited to vaginal bleeding, contractions, leaking of fluid and fetal movement were reviewed in detail with the patient. Please refer to After Visit Summary for other counseling recommendations.   No follow-ups on file.  No future appointments.  Allie BossierMyra C Dodie Parisi, MD

## 2018-02-18 ENCOUNTER — Ambulatory Visit (INDEPENDENT_AMBULATORY_CARE_PROVIDER_SITE_OTHER): Payer: Medicaid Other | Admitting: Family Medicine

## 2018-02-18 ENCOUNTER — Encounter: Payer: Self-pay | Admitting: Family Medicine

## 2018-02-18 ENCOUNTER — Telehealth (HOSPITAL_COMMUNITY): Payer: Self-pay | Admitting: *Deleted

## 2018-02-18 VITALS — BP 118/73 | HR 73 | Wt 166.4 lb

## 2018-02-18 DIAGNOSIS — H903 Sensorineural hearing loss, bilateral: Secondary | ICD-10-CM

## 2018-02-18 DIAGNOSIS — Z3403 Encounter for supervision of normal first pregnancy, third trimester: Secondary | ICD-10-CM

## 2018-02-18 NOTE — Patient Instructions (Signed)
Come to the MAU (maternity admission unit) for 1) Strong contractions every 2-3 minutes for at least 1 hour that do not go away when you drink water or take a warm shower. These contractions will be so strong all you can do is breath through them 2) Vaginal bleeding- anything more than spotting 3) Loss of fluid like you broke your water 4) Decreased movement of your baby  

## 2018-02-18 NOTE — Telephone Encounter (Signed)
Preadmission screen  

## 2018-02-18 NOTE — Progress Notes (Signed)
   PRENATAL VISIT NOTE  Subjective:  Michele Dixon is a 22 y.o. G1P0000 at 5513w0d being seen today for ongoing prenatal care.  She is currently monitored for the following issues for this low-risk pregnancy and has Cochlear implant in place; Medication exposure during first trimester of pregnancy; Encounter for supervision of normal first pregnancy in third trimester; Language barrier; Bilateral sensorineural hearing loss; Congenital anomaly of ear ossicles; Migraine headache; and Unwanted fertility on their problem list.  Patient reports no complaints.  Contractions: Not present. Vag. Bleeding: None.  Movement: Present. Denies leaking of fluid.   The following portions of the patient's history were reviewed and updated as appropriate: allergies, current medications, past family history, past medical history, past social history, past surgical history and problem list. Problem list updated.  Objective:   Vitals:   02/18/18 1140  BP: 118/73  Pulse: 73  Weight: 166 lb 6.4 oz (75.5 kg)    Fetal Status: Fetal Heart Rate (bpm): 143 Fundal Height: 40 cm Movement: Present  Presentation: Vertex  General:  Alert, oriented and cooperative. Patient is in no acute distress.  Skin: Skin is warm and dry. No rash noted.   Cardiovascular: Normal heart rate noted  Respiratory: Normal respiratory effort, no problems with respiration noted  Abdomen: Soft, gravid, appropriate for gestational age.  Pain/Pressure: Present     Pelvic: Cervical exam performed Dilation: 1.5 Effacement (%): 50 Station: -3  Extremities: Normal range of motion.  Edema: None  Mental Status: Normal mood and affect. Normal behavior. Normal judgment and thought content.   Assessment and Plan:  Pregnancy: G1P0000 at 5413w0d  1. Bilateral sensorineural hearing loss Used ASL interpreter  2. Encounter for supervision of normal first pregnancy in third trimester Up to date Declined membrane sweeping today. Will call if she wants an  appt Discussed ways to bring about labor Reviewed resources including [facility] and Evidence based birth Discussed importance of kick counts IOL scheduled on 8/12 at 0630 (41wk5d)- Needs NST/AFI at 41wk (8/7) and repeat NST (8/9)   Term labor symptoms and general obstetric precautions including but not limited to vaginal bleeding, contractions, leaking of fluid and fetal movement were reviewed in detail with the patient. Please refer to After Visit Summary for other counseling recommendations.   Return in about 1 week (around 02/25/2018) for NST/AFI, prenatal care.  Future Appointments  Date Time Provider Department Center  02/25/2018  8:15 AM [facility] NURSE [facility]  02/27/2018  8:15 AM [facility] NURSE [facility]  03/02/2018  6:30 AM [facility] ROOM [facility] None    Federico FlakeKimberly Niles Khayri Kargbo, MD

## 2018-02-21 ENCOUNTER — Inpatient Hospital Stay (HOSPITAL_COMMUNITY)
Admission: AD | Admit: 2018-02-21 | Discharge: 2018-02-21 | Disposition: A | Discharge status: Home / Self Care | Payer: Medicaid Other | Source: Ambulatory Visit | Attending: Obstetrics and Gynecology | Admitting: Obstetrics and Gynecology

## 2018-02-21 ENCOUNTER — Encounter (HOSPITAL_COMMUNITY): Payer: Self-pay | Admitting: *Deleted

## 2018-02-21 DIAGNOSIS — O479 False labor, unspecified: Secondary | ICD-10-CM

## 2018-02-21 MED ORDER — OXYCODONE HCL 5 MG PO TABS
5.0000 mg | ORAL_TABLET | Freq: Once | ORAL | Status: AC
  Administered 2018-02-21: 5 mg via ORAL
  Filled 2018-02-21: qty 1

## 2018-02-21 NOTE — MAU Note (Signed)
I have communicated with Dr. Earlene PlaterWallace and reviewed vital signs:  Vitals:   02/21/18 1950  BP: 123/90  Pulse: (!) 111  Resp: 18  Temp: 98.9 F (37.2 C)    Vaginal exam:  Dilation: 3 Effacement (%): 70, 80 Station: -1, -2 Presentation: Vertex Exam by:: K.Jeston Junkins,RN,   Also reviewed contraction pattern and that non-stress test is reactive.  It has been documented that patient is contracting every 5-7 minutes with no cervical change over 1.5 hours not indicating active labor.FERN negative.  Patient denies any other complaints.  Based on this report provider has given order for discharge.  A discharge order and diagnosis entered by a provider.   Labor discharge instructions reviewed with patient.         2

## 2018-02-21 NOTE — MAU Note (Signed)
Pt reports she has had contractions since  Last night . Reports leakng pink fluid

## 2018-02-21 NOTE — Discharge Instructions (Signed)
Call your OB Clinic or go to [facility] if: °· You begin to have strong, frequent contractions °· Your water breaks.  Sometimes it is a big gush of fluid, sometimes it is just a trickle that keeps getting your panties wet or running down your legs °· You have vaginal bleeding.  It is normal to have a small amount of spotting if your cervix was checked.  °· You don't feel your baby moving like normal.  If you don't, get you something to eat and drink and lay down and focus on feeling your baby move.  You should feel at least 10 movements in 2 hours.  If you don't, you should call the office or go to [facility].  ° ° °Braxton Hicks Contractions °Contractions of the uterus can occur throughout pregnancy, but they are not always a sign that you are in labor. You may have practice contractions called Braxton Hicks contractions. These false labor contractions are sometimes confused with true labor. °What are Braxton Hicks contractions? °Braxton Hicks contractions are tightening movements that occur in the muscles of the uterus before labor. Unlike true labor contractions, these contractions do not result in opening (dilation) and thinning of the cervix. Toward the end of pregnancy (32-34 weeks), Braxton Hicks contractions can happen more often and may become stronger. These contractions are sometimes difficult to tell apart from true labor because they can be very uncomfortable. You should not feel embarrassed if you go to the hospital with false labor. °Sometimes, the only way to tell if you are in true labor is for your health care provider to look for changes in the cervix. The health care provider will do a physical exam and may monitor your contractions. If you are not in true labor, the exam should show that your cervix is not dilating and your water has not broken. °If there are other health problems associated with your pregnancy, it is completely safe for you to be sent home with false labor. You may  continue to have Braxton Hicks contractions until you go into true labor. °How to tell the difference between true labor and false labor °True labor °· Contractions last 30-70 seconds. °· Contractions become very regular. °· Discomfort is usually felt in the top of the uterus, and it spreads to the lower abdomen and low back. °· Contractions do not go away with walking. °· Contractions usually become more intense and increase in frequency. °· The cervix dilates and gets thinner. °False labor °· Contractions are usually shorter and not as strong as true labor contractions. °· Contractions are usually irregular. °· Contractions are often felt in the front of the lower abdomen and in the groin. °· Contractions may go away when you walk around or change positions while lying down. °· Contractions get weaker and are shorter-lasting as time goes on. °· The cervix usually does not dilate or become thin. °Follow these instructions at home: °· Take over-the-counter and prescription medicines only as told by your health care provider. °· Keep up with your usual exercises and follow other instructions from your health care provider. °· Eat and drink lightly if you think you are going into labor. °· If Braxton Hicks contractions are making you uncomfortable: °? Change your position from lying down or resting to walking, or change from walking to resting. °? Sit and rest in a tub of warm water. °? Drink enough fluid to keep your urine pale yellow. Dehydration may cause these contractions. °? Do slow   and deep breathing several times an hour. °· Keep all follow-up prenatal visits as told by your health care provider. This is important. °Contact a health care provider if: °· You have a fever. °· You have continuous pain in your abdomen. °Get help right away if: °· Your contractions become stronger, more regular, and closer together. °· You have fluid leaking or gushing from your vagina. °· You pass blood-tinged mucus (bloody  show). °· You have bleeding from your vagina. °· You have low back pain that you never had before. °· You feel your baby’s head pushing down and causing pelvic pressure. °· Your baby is not moving inside you as much as it used to. °Summary °· Contractions that occur before labor are called Braxton Hicks contractions, false labor, or practice contractions. °· Braxton Hicks contractions are usually shorter, weaker, farther apart, and less regular than true labor contractions. True labor contractions usually become progressively stronger and regular and they become more frequent. °· Manage discomfort from Braxton Hicks contractions by changing position, resting in a warm bath, drinking plenty of water, or practicing deep breathing. °This information is not intended to replace advice given to you by your health care provider. Make sure you discuss any questions you have with your health care provider. °Document Released: 11/21/2016 Document Revised: 11/21/2016 Document Reviewed: 11/21/2016 °Elsevier Interactive Patient Education © 2018 [employer] ° ° °

## 2018-02-22 ENCOUNTER — Encounter (HOSPITAL_COMMUNITY): Payer: Self-pay

## 2018-02-22 ENCOUNTER — Other Ambulatory Visit: Payer: Self-pay

## 2018-02-22 ENCOUNTER — Inpatient Hospital Stay (HOSPITAL_COMMUNITY)
Admission: AD | Admit: 2018-02-22 | Discharge: 2018-02-25 | DRG: 805 | Disposition: A | Discharge status: Home / Self Care | Payer: Medicaid Other | Attending: Obstetrics & Gynecology | Admitting: Obstetrics & Gynecology

## 2018-02-22 ENCOUNTER — Inpatient Hospital Stay (HOSPITAL_COMMUNITY): Payer: Medicaid Other | Admitting: Anesthesiology

## 2018-02-22 DIAGNOSIS — O411290 Chorioamnionitis, unspecified trimester, not applicable or unspecified: Secondary | ICD-10-CM | POA: Diagnosis present

## 2018-02-22 DIAGNOSIS — Z3483 Encounter for supervision of other normal pregnancy, third trimester: Secondary | ICD-10-CM | POA: Diagnosis present

## 2018-02-22 DIAGNOSIS — O9902 Anemia complicating childbirth: Secondary | ICD-10-CM | POA: Diagnosis present

## 2018-02-22 DIAGNOSIS — O9989 Other specified diseases and conditions complicating pregnancy, childbirth and the puerperium: Secondary | ICD-10-CM | POA: Diagnosis present

## 2018-02-22 DIAGNOSIS — D649 Anemia, unspecified: Secondary | ICD-10-CM | POA: Diagnosis present

## 2018-02-22 DIAGNOSIS — Z3A40 40 weeks gestation of pregnancy: Secondary | ICD-10-CM | POA: Diagnosis not present

## 2018-02-22 DIAGNOSIS — Z3009 Encounter for other general counseling and advice on contraception: Secondary | ICD-10-CM

## 2018-02-22 DIAGNOSIS — O411230 Chorioamnionitis, third trimester, not applicable or unspecified: Secondary | ICD-10-CM | POA: Diagnosis present

## 2018-02-22 DIAGNOSIS — Z789 Other specified health status: Secondary | ICD-10-CM | POA: Diagnosis present

## 2018-02-22 DIAGNOSIS — Z3403 Encounter for supervision of normal first pregnancy, third trimester: Secondary | ICD-10-CM

## 2018-02-22 DIAGNOSIS — H903 Sensorineural hearing loss, bilateral: Secondary | ICD-10-CM | POA: Diagnosis present

## 2018-02-22 LAB — CBC
HEMATOCRIT: 33.7 % — AB (ref 36.0–46.0)
Hemoglobin: 11.6 g/dL — ABNORMAL LOW (ref 12.0–15.0)
MCH: 31.7 pg (ref 26.0–34.0)
MCHC: 34.4 g/dL (ref 30.0–36.0)
MCV: 92.1 fL (ref 78.0–100.0)
Platelets: 124 10*3/uL — ABNORMAL LOW (ref 150–400)
RBC: 3.66 MIL/uL — ABNORMAL LOW (ref 3.87–5.11)
RDW: 13.7 % (ref 11.5–15.5)
WBC: 14.7 10*3/uL — ABNORMAL HIGH (ref 4.0–10.5)

## 2018-02-22 LAB — RPR: RPR Ser Ql: NONREACTIVE

## 2018-02-22 LAB — TYPE AND SCREEN
ABO/RH(D): O POS
Antibody Screen: NEGATIVE

## 2018-02-22 LAB — POCT FERN TEST: POCT FERN TEST: POSITIVE

## 2018-02-22 LAB — ABO/RH: ABO/RH(D): O POS

## 2018-02-22 MED ORDER — GENTAMICIN SULFATE 40 MG/ML IJ SOLN
160.0000 mg | Freq: Three times a day (TID) | INTRAVENOUS | Status: DC
  Administered 2018-02-22 – 2018-02-23 (×2): 160 mg via INTRAVENOUS
  Filled 2018-02-22 (×3): qty 4

## 2018-02-22 MED ORDER — LACTATED RINGERS IV SOLN
INTRAVENOUS | Status: DC
  Administered 2018-02-22 (×4): via INTRAVENOUS

## 2018-02-22 MED ORDER — DIPHENHYDRAMINE HCL 50 MG/ML IJ SOLN: 12.5000 mg | INTRAMUSCULAR | Status: DC | PRN

## 2018-02-22 MED ORDER — ONDANSETRON HCL 4 MG/2ML IJ SOLN
4.0000 mg | Freq: Four times a day (QID) | INTRAMUSCULAR | Status: DC | PRN
  Administered 2018-02-22: 4 mg via INTRAVENOUS
  Filled 2018-02-22: qty 2

## 2018-02-22 MED ORDER — LIDOCAINE HCL (PF) 1 % IJ SOLN
INTRAMUSCULAR | Status: DC | PRN
  Administered 2018-02-22: 4 mL via EPIDURAL
  Administered 2018-02-22: 5 mL via EPIDURAL

## 2018-02-22 MED ORDER — OXYCODONE-ACETAMINOPHEN 5-325 MG PO TABS: 1.0000 | ORAL_TABLET | ORAL | Status: DC | PRN

## 2018-02-22 MED ORDER — OXYCODONE-ACETAMINOPHEN 5-325 MG PO TABS: 2.0000 | ORAL_TABLET | ORAL | Status: DC | PRN

## 2018-02-22 MED ORDER — PHENYLEPHRINE 40 MCG/ML (10ML) SYRINGE FOR IV PUSH (FOR BLOOD PRESSURE SUPPORT)
80.0000 ug | PREFILLED_SYRINGE | INTRAVENOUS | Status: DC | PRN
  Administered 2018-02-22: 80 ug via INTRAVENOUS
  Filled 2018-02-22: qty 5

## 2018-02-22 MED ORDER — ACETAMINOPHEN 500 MG PO TABS
1000.0000 mg | ORAL_TABLET | Freq: Once | ORAL | Status: AC
  Administered 2018-02-22: 1000 mg via ORAL
  Filled 2018-02-22: qty 2

## 2018-02-22 MED ORDER — FENTANYL 2.5 MCG/ML BUPIVACAINE 1/10 % EPIDURAL INFUSION (WH - ANES)
14.0000 mL/h | INTRAMUSCULAR | Status: DC | PRN
  Administered 2018-02-22 (×3): 14 mL/h via EPIDURAL
  Filled 2018-02-22 (×3): qty 100

## 2018-02-22 MED ORDER — OXYTOCIN 40 UNITS IN LACTATED RINGERS INFUSION - SIMPLE MED
2.5000 [IU]/h | INTRAVENOUS | Status: DC
  Filled 2018-02-22: qty 1000

## 2018-02-22 MED ORDER — EPHEDRINE 5 MG/ML INJ
10.0000 mg | INTRAVENOUS | Status: DC | PRN
  Filled 2018-02-22: qty 2

## 2018-02-22 MED ORDER — LACTATED RINGERS IV SOLN
500.0000 mL | INTRAVENOUS | Status: DC | PRN
  Administered 2018-02-22: 500 mL via INTRAVENOUS

## 2018-02-22 MED ORDER — OXYTOCIN BOLUS FROM INFUSION
500.0000 mL | Freq: Once | INTRAVENOUS | Status: AC
  Administered 2018-02-23: 500 mL via INTRAVENOUS

## 2018-02-22 MED ORDER — TERBUTALINE SULFATE 1 MG/ML IJ SOLN
0.2500 mg | Freq: Once | INTRAMUSCULAR | Status: DC | PRN
  Filled 2018-02-22: qty 1

## 2018-02-22 MED ORDER — LACTATED RINGERS IV SOLN: 500.0000 mL | Freq: Once | INTRAVENOUS | Status: DC

## 2018-02-22 MED ORDER — LIDOCAINE HCL (PF) 1 % IJ SOLN
30.0000 mL | INTRAMUSCULAR | Status: DC | PRN
  Filled 2018-02-22: qty 30

## 2018-02-22 MED ORDER — OXYTOCIN 40 UNITS IN LACTATED RINGERS INFUSION - SIMPLE MED
1.0000 m[IU]/min | INTRAVENOUS | Status: DC
  Administered 2018-02-22: 2 m[IU]/min via INTRAVENOUS

## 2018-02-22 MED ORDER — SODIUM CHLORIDE 0.9 % IV SOLN
2.0000 g | Freq: Four times a day (QID) | INTRAVENOUS | Status: DC
  Administered 2018-02-22 – 2018-02-23 (×3): 2 g via INTRAVENOUS
  Filled 2018-02-22 (×3): qty 2
  Filled 2018-02-22: qty 2000

## 2018-02-22 MED ORDER — SOD CITRATE-CITRIC ACID 500-334 MG/5ML PO SOLN
30.0000 mL | ORAL | Status: DC | PRN
  Administered 2018-02-22: 30 mL via ORAL
  Filled 2018-02-22: qty 15

## 2018-02-22 MED ORDER — PHENYLEPHRINE 40 MCG/ML (10ML) SYRINGE FOR IV PUSH (FOR BLOOD PRESSURE SUPPORT)
80.0000 ug | PREFILLED_SYRINGE | INTRAVENOUS | Status: DC | PRN
  Filled 2018-02-22: qty 5
  Filled 2018-02-22 (×2): qty 10

## 2018-02-22 MED ORDER — FAMOTIDINE IN NACL 20-0.9 MG/50ML-% IV SOLN
20.0000 mg | Freq: Once | INTRAVENOUS | Status: AC
  Administered 2018-02-22: 20 mg via INTRAVENOUS
  Filled 2018-02-22: qty 50

## 2018-02-22 MED ORDER — FENTANYL CITRATE (PF) 100 MCG/2ML IJ SOLN
100.0000 ug | INTRAMUSCULAR | Status: DC | PRN
  Administered 2018-02-22 (×2): 100 ug via INTRAVENOUS
  Filled 2018-02-22 (×2): qty 2

## 2018-02-22 MED ORDER — ACETAMINOPHEN 325 MG PO TABS: 650.0000 mg | ORAL_TABLET | ORAL | Status: DC | PRN

## 2018-02-22 NOTE — Anesthesia Pain Management Evaluation Note (Signed)
  CRNA Pain Management Visit Note  Patient: Michele Dixon, 22 y.o., female  "Hello I am a [facility] the anesthesia [facility]. We have an anesthesia team available at all times to provide care throughout the hospital, including epidural management and anesthesia for C-section. I don't know your plan for the delivery whether it a natural birth, water birth, IV sedation, nitrous supplementation, doula or epidural, but we want to meet your pain goals."   1.Was your pain managed to your expectations on prior hospitalizations?   No prior hospitalizations  2.What is your expectation for pain management during this hospitalization?     Epidural  3.How can we help you reach that goal? Epidural in place at time of visit  Record the patient's initial score and the patient's pain goal.   Pain: 0  Pain Goal: 4 [facility] you to be able to say your pain was always managed very well.  Michele Dixon,Michele Dixon 02/22/2018

## 2018-02-22 NOTE — Progress Notes (Signed)
Michele Dixon is a 22 y.o. G1P0000 at 929w4d admitted for SROM.  Subjective: Patient comfortable.  Objective: BP (!) 97/57   Pulse 92   Temp 98 F (36.7 C) (Axillary)   Resp 18   Ht 5\' 3"  (1.6 m)   Wt 76.2 kg (168 lb)   LMP 05/14/2017 Comment: late on LMP, pregnancy test negative  SpO2 97%   BMI 29.76 kg/m  No intake/output data recorded. Total I/O In: 763.4 [I.V.:763.4] Out: 650 [Urine:650]  FHT:  FHR: 110 bpm, variability: moderate,  accelerations:  Present,  decelerations:  Absent UC:   regular, every 2-3 minutes SVE:   Dilation: 5 Effacement (%): 80 Station: -2 Exam by:: Michele DusterMichelle, RN   Labs: Lab Results  Component Value Date   WBC 14.7 (H) 02/22/2018   HGB 11.6 (L) 02/22/2018   HCT 33.7 (L) 02/22/2018   MCV 92.1 02/22/2018   PLT 124 (L) 02/22/2018    Assessment / Plan: Spontaneous labor, progressing normally. Pit has been started.  Labor: Progressing on Pitocin Fetal Wellbeing:  Category I Pain Control:  Epidural I/D:  n/a Anticipated MOD:  NSVD   Candis SchatzPatricia Elma Shands, DO Family Medicine, PGY-3

## 2018-02-22 NOTE — Progress Notes (Signed)
Pharmacy Antibiotic Note  Michele GreenlandJessica Dixon is a 22 y.o. female admitted on 02/22/2018 with ROM at 6772w4d gestation.  Pharmacy has been consulted for Gentamicin dosing due to maternal temp during labor. Amp and Gentamicin ordered to r/o Triple I (Chorioamnionitis).  Plan: Gentamicin 160mg  IV q8h Will assess need for further labs based on duration and clinical status of patient  Height: 5\' 3"  (160 cm) Weight: 168 lb (76.2 kg) IBW/kg (Calculated) : 52.4 Adjusted/ Dosing BW: 59.5kg  Temp (24hrs), Avg:99 F (37.2 C), Min:98 F (36.7 C), Max:100.4 F (38 C)  Recent Labs  Lab 02/22/18 0513  WBC 14.7*    CrCl cannot be calculated (Patient's most recent lab result is older than the maximum 21 days allowed.).   Estimated SCr= 0.68 with est CrCl ~ 11905ml/min  No Known Allergies  Antimicrobials this admission: Ampicillin 2 gram IV q6h 8/4 >>   Thank you for allowing pharmacy to be a part of this patient's care.  Claybon Jabsngel, Tam Savoia G 02/22/2018 6:13 PM

## 2018-02-22 NOTE — Progress Notes (Signed)
Michele Dixon is a 22 y.o. G1P0000 at 2136w4d by ultrasound admitted for rupture of membranes  Subjective:   Objective: BP 116/82   Pulse (!) 105   Temp (!) 100.4 F (38 C) (Oral)   Resp 18   Ht 5\' 3"  (1.6 m)   Wt 168 lb (76.2 kg)   LMP 05/14/2017 Comment: late on LMP, pregnancy test negative  SpO2 97%   BMI 29.76 kg/m  No intake/output data recorded. Total I/O In: 1870.8 [P.O.:240; I.V.:1630.8] Out: 650 [Urine:650]  FHT:  FHR: 130's bpm, variability: moderate,  accelerations:  Present,  decelerations:  Absent UC:   regular, every 2 minutes SVE:   Dilation: 6.5 Effacement (%): 80 Station: -1 Exam by:: Michele DusterMichelle, RN   Labs: Lab Results  Component Value Date   WBC 14.7 (H) 02/22/2018   HGB 11.6 (L) 02/22/2018   HCT 33.7 (L) 02/22/2018   MCV 92.1 02/22/2018   PLT 124 (L) 02/22/2018    Assessment / Plan: Augmentation of labor, progressing well  Labor: Progressing normally Preeclampsia:  no signs or symptoms of toxicity Fetal Wellbeing:  Category I Pain Control:  Epidural I/D:  n/a Anticipated MOD:  NSVD  IUPC placed. Pt now febrile will start antibiotics  Michele Dixon 02/22/2018, 5:56 PM

## 2018-02-22 NOTE — Progress Notes (Addendum)
Went in patient's room to rupture forebag remaining. Amniotic hook used without issue, fetal head well applied. Light mec noted. No pulsating cord noted.  Cervical exam changed to 5/90/-2.   Michele SchatzPatricia Coutney Wildermuth, DO Family Medicine, PGY-3

## 2018-02-22 NOTE — Anesthesia Procedure Notes (Signed)
Epidural Patient location during procedure: OB Start time: 02/22/2018 7:46 AM End time: 02/22/2018 7:50 AM  Staffing Anesthesiologist: Beryle LatheBrock, Thomas E, MD Performed: anesthesiologist   Preanesthetic Checklist Completed: patient identified, pre-op evaluation, timeout performed, IV checked, risks and benefits discussed and monitors and equipment checked  Epidural Patient position: sitting Prep: DuraPrep Patient monitoring: continuous pulse ox and blood pressure Approach: midline Location: L2-L3 Injection technique: LOR saline  Needle:  Needle type: Tuohy  Needle gauge: 17 G Needle length: 9 cm Needle insertion depth: 4.5 cm Catheter size: 19 Gauge Catheter at skin depth: 10 cm Test dose: negative and Other (1% lidocaine)  Additional Notes Patient identified. Risks including, but not limited to, bleeding, infection, nerve damage, paralysis, inadequate analgesia, blood pressure changes, nausea, vomiting, allergic reaction, postpartum back pain, itching, and headache were discussed. Patient expressed understanding and wished to proceed. Sterile prep and drape, including hand hygiene, mask, and sterile gloves were used. The patient was positioned and the spine was prepped. The skin was anesthetized with lidocaine. No paraesthesia or other complication noted. The patient did not experience any signs of intravascular injection such as tinnitus or metallic taste in mouth, nor signs of intrathecal spread such as rapid motor block. Please see nursing notes for vital signs. The patient tolerated the procedure well.   Leslye Peerhomas Brock, MDReason for block:procedure for pain

## 2018-02-22 NOTE — Progress Notes (Signed)
Michele GreenlandJessica Dixon is a 22 y.o. G1P0000 at 168w4d admitted for rupture of membranes approx 0454, clear.   Subjective: Comfortable with epidural .  Objective: BP 106/69   Pulse (!) 111   Temp 98.1 F (36.7 C) (Oral)   Resp 18   Ht 5\' 3"  (1.6 m)   Wt 76.2 kg (168 lb)   LMP 05/14/2017 Comment: late on LMP, pregnancy test negative  SpO2 97%   BMI 29.76 kg/m  No intake/output data recorded. Total I/O In: 763.4 [I.V.:763.4] Out: -   FHT:  FHR: 130 bpm, variability: moderate,  accelerations:  Present,  decelerations:  Absent UC:   regular, every 2-3 minutes SVE:   Dilation: 4 Effacement (%): 60 Station: -2 Exam by:: wallace,md  Labs: Lab Results  Component Value Date   WBC 14.7 (H) 02/22/2018   HGB 11.6 (L) 02/22/2018   HCT 33.7 (L) 02/22/2018   MCV 92.1 02/22/2018   PLT 124 (L) 02/22/2018    Assessment / Plan: Spontaneous labor, progressing normally. Minimal variability at this time but accels noted; previously moderate variability plus accels, so still Cat 1. Forebag noted by nursing earlier despite SROM at home.  Labor: Progressing normally Fetal Wellbeing:  Category I ROM: SROM 0454 with forebag Pain Control:  Epidural I/D:  n/a Anticipated MOD:  NSVD   Candis SchatzPatricia Levina Boyack, DO Family Medicine, PGY-3

## 2018-02-22 NOTE — H&P (Signed)
LABOR AND DELIVERY ADMISSION HISTORY AND PHYSICAL NOTE  Maximino GreenlandJessica Dillehay is a 22 y.o. female G1P0000 with IUP at 748w4d by LMP c/w 11 wk sono presenting for SROM. Patient was in MAU for labor check, called out that she had a gush of fluid. ROM at 4:54 with clear fluid.  She reports positive fetal movement. She denies vaginal bleeding.  Prenatal History/Complications: [facility] 11 [facility].  Pregnancy complications:  - medication exposure during 1st trimester: took cytotec 400 PO, valium PO and flagyl PO prior to elective AB but decided against procedure   Past Medical History: Past Medical History:  Diagnosis Date  . Cochlear implant in place    age 89  . Exudative pharyngitis 01/01/2013  . FUO (fever of unknown origin) 01/01/2013  . Infection    UTI  . Migraines   . Ovarian cyst    ~6th grade  . Sensorineural hearing loss (SNHL) of both ears    can read lips but unsure about medical terms  . Syncope and collapse 01/01/2013    Past Surgical History: Past Surgical History:  Procedure Laterality Date  . COCHLEAR IMPLANT Right age 89  . FOREIGN BODY REMOVAL Left 10/29/2016   Procedure: EXCISION FOREIGN BODY LEFT ARM;  Surgeon: De BlanchLuke Aaron Kinsinger, MD;  [facility];  Service: General;  Laterality: Left;  . NORPLANT REMOVAL Left 05/15/2016   Procedure: Attempted REMOVAL OF Nexplanon;  Surgeon: Allie BossierMyra C Dove, MD;  Location: [facility];  Service: Gynecology;  Laterality: Left;  Left upper arm.    Obstetrical History: OB History    Gravida  1   Para  0   Term  0   Preterm  0   AB  0   Living  0     SAB  0   TAB  0   Ectopic  0   Multiple  0   Live Births  0           Social History: Social History   Socioeconomic History  . Marital status: Single    Spouse name: Not on file  . Number of children: Not on file  . Years of education: Not on file  . Highest education level: Not on file  Occupational History  . Not on file   Social Needs  . Financial resource strain: Not on file  . Food insecurity:    Worry: Not on file    Inability: Not on file  . Transportation needs:    Medical: Not on file    Non-medical: Not on file  Tobacco Use  . Smoking status: Never Smoker  . Smokeless tobacco: Never Used  Substance and Sexual Activity  . Alcohol use: Not Currently    Comment: occasional  . Drug use: No  . Sexual activity: Yes  Lifestyle  . Physical activity:    Days per week: Not on file    Minutes per session: Not on file  . Stress: Not on file  Relationships  . Social connections:    Talks on phone: Not on file    Gets together: Not on file    Attends religious service: Not on file    Active member of club or organization: Not on file    Attends meetings of clubs or organizations: Not on file    Relationship status: Not on file  Other Topics Concern  . Not on file  Social History Narrative  . Not on file  Family History: Family History  Problem Relation Age of Onset  . Hypertension Mother   . Hyperlipidemia Father   . Prostate cancer Father   . Pancreatitis Sister   . Pancreatitis Maternal Aunt   . Pancreatitis Maternal Grandmother     Allergies: No Known Allergies  Medications Prior to Admission  Medication Sig Dispense Refill Last Dose  . Prenatal Vit-Fe Fumarate-FA (PRENATAL VITAMIN PO) Take by mouth.   02/21/2018 at Unknown time  . acetaminophen (TYLENOL) 325 MG tablet Take 650 mg by mouth every 6 (six) hours as needed.   Not Taking  . IRON PO Take by mouth.   Taking     Review of Systems  All systems reviewed and negative except as stated in HPI  Physical Exam Blood pressure 127/83, pulse 93, temperature 98.8 F (37.1 C), temperature source Oral, resp. rate 18, height 5\' 3"  (1.6 m), weight 168 lb (76.2 kg), last menstrual period 05/14/2017. General appearance: alert, oriented, NAD Lungs: normal respiratory effort Heart: regular rate Abdomen: soft, non-tender; gravid, FH  appropriate for GA Extremities: No calf swelling or tenderness Presentation: cephalic Fetal monitoring: 130 bpm. Moderate variability, +acels, no decels  Uterine activity: q2-4 min  Dilation: 4 Effacement (%): 80 Station: -2 Exam by:: Jehu Mccauslin,md  Prenatal labs: ABO, Rh: --/--/O POS (08/04 8295) Antibody: NEG (08/04 6213) Rubella: 2.47 (01/15 1018) RPR: Non Reactive (04/10 0825)  HBsAg: Negative (01/15 1018)  HIV: Non Reactive (04/10 0825)  GC/Chlamydia: Negative GBS:   Negative  2-hr GTT: Negative  Genetic screening:  Normal  Anatomy US: Normal   Prenatal Transfer Tool  Maternal Diabetes: No Genetic Screening: Normal Maternal Ultrasounds/Referrals: Normal Fetal Ultrasounds or other Referrals:  None Maternal Substance Abuse:  No Significant Maternal Medications:  None Significant Maternal Lab Results: Lab values include: Group B Strep negative  Results for orders placed or performed during the hospital encounter of 02/22/18 (from the past 24 hour(s))  POCT fern test   Collection Time: 02/22/18  4:55 AM  Result Value Ref Range   POCT Fern Test Positive = ruptured amniotic membanes   CBC   Collection Time: 02/22/18  5:13 AM  Result Value Ref Range   WBC 14.7 (H) 4.0 - 10.5 K/uL   RBC 3.66 (L) 3.87 - 5.11 MIL/uL   Hemoglobin 11.6 (L) 12.0 - 15.0 g/dL   HCT 08.6 (L) 57.8 - 46.9 %   MCV 92.1 78.0 - 100.0 fL   MCH 31.7 26.0 - 34.0 pg   MCHC 34.4 30.0 - 36.0 g/dL   RDW 62.9 52.8 - 41.3 %   Platelets 124 (L) 150 - 400 K/uL  Type and screen [facility]   Collection Time: 02/22/18  5:13 AM  Result Value Ref Range   ABO/RH(D) O POS    Antibody Screen NEG    Sample Expiration      02/25/2018 Performed at [facility], [address] [place], [place] [address]     Patient Active Problem List   Diagnosis Date Noted  . Unwanted fertility 01/21/2018  . Medication exposure during first trimester of pregnancy 08/05/2017  . Encounter for supervision  of normal first pregnancy in third trimester 08/05/2017  . Language barrier 08/05/2017  . Bilateral sensorineural hearing loss 06/07/2013  . 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 Migraine headache 07/05/2008  . 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 G1P0000 at [redacted]w[redacted]d here for SROM.   #Labor: Latent. Starting to  have a more adequate ctx pattern. Discussed augmentation with Pitocin if needed. Patient would like to consider further.  #Pain: Epidural upon request  #FWB: Cat I  #ID:  GBS [id number]  #MOF: Breast  #MOC: BTL (papers signed 7/3)  #Circ:  Yes, need to clarify inpatient vs. Outpatient   De Hollingshead 02/22/2018, 7:35 AM

## 2018-02-22 NOTE — Anesthesia Preprocedure Evaluation (Signed)
Anesthesia Evaluation  Patient identified by MRN, date of birth, ID band Patient awake    Reviewed: Allergy & Precautions, NPO status , Patient's Chart, lab work & pertinent test results  History of Anesthesia Complications Negative for: history of anesthetic complications  Airway Mallampati: II  TM Distance: >3 FB Neck ROM: Full    Dental   Pulmonary neg pulmonary ROS,    breath sounds clear to auscultation       Cardiovascular negative cardio ROS   Rhythm:Regular Rate:Normal     Neuro/Psych  Headaches,  Deaf Cochlear Implant Present  negative psych ROS   GI/Hepatic negative GI ROS, Neg liver ROS,   Endo/Other  negative endocrine ROS  Renal/GU negative Renal ROS  negative genitourinary   Musculoskeletal negative musculoskeletal ROS (+)   Abdominal   Peds  Hematology  (+) anemia ,  Thrombocytopenia    Anesthesia Other Findings   Reproductive/Obstetrics (+) Pregnancy                             Anesthesia Physical Anesthesia Plan  ASA: II  Anesthesia Plan: Epidural   Post-op Pain Management:    Induction:   PONV Risk Score and Plan: 2 and Treatment may vary due to age or medical condition  Airway Management Planned: Natural Airway  Additional Equipment: None  Intra-op Plan:   Post-operative Plan:   Informed Consent: I have reviewed the patients History and Physical, chart, labs and discussed the procedure including the risks, benefits and alternatives for the proposed anesthesia with the patient or authorized representative who has indicated his/her understanding and acceptance.     Plan Discussed with: Anesthesiologist  Anesthesia Plan Comments: (Labs reviewed. Platelets acceptable, patient not taking any blood thinning medications. Per RN, FHR tracing reported to be stable enough for sitting procedure. Risks and benefits discussed with patient, including PDPH,  backache, failed epidural, allergic reaction, and nerve injury. Patient expressed understanding and wished to proceed.)        Anesthesia Quick Evaluation

## 2018-02-22 NOTE — Progress Notes (Signed)
Michele Dixon is a 22 y.o. G1P0000 at 6673w4d admitted for SROM.  Subjective: Strip check  Objective: BP 121/84   Pulse 89   Temp 99.1 F (37.3 C) (Axillary)   Resp 18   Ht 5\' 3"  (1.6 m)   Wt 76.2 kg (168 lb)   LMP 05/14/2017 Comment: late on LMP, pregnancy test negative  SpO2 97%   BMI 29.76 kg/m  No intake/output data recorded. Total I/O In: 1870.8 [P.O.:240; I.V.:1630.8] Out: 650 [Urine:650]  FHT:  FHR: 125 bpm, variability: moderate,  accelerations:  Present,  decelerations:  Absent UC:   regular, every 2 minutes SVE:   Dilation: 6.5 Effacement (%): 80 Station: -1 Exam by:: [name], RN   Labs: Lab Results  Component Value Date   WBC 14.7 (H) 02/22/2018   HGB 11.6 (L) 02/22/2018   HCT 33.7 (L) 02/22/2018   MCV 92.1 02/22/2018   PLT 124 (L) 02/22/2018    Assessment / Plan: Spontaneous labor, progressing normally. Pit continued.   Fetal Wellbeing:  Category I Pain Control:  Labor support without medications and IV pain meds I/D:  n/a Anticipated MOD:  NSVD   Michele SchatzPatricia Gwyneth Fernandez, DO Family Medicine, PGY-3

## 2018-02-22 NOTE — MAU Note (Signed)
Pt. Stated water broke after returning from bathroom. Fern +.

## 2018-02-22 NOTE — MAU Note (Signed)
Pt. States ctx have been worse since leaving. Rates them at a 10/10. Reports walking for hours.

## 2018-02-22 NOTE — Progress Notes (Signed)
LABOR PROGRESS NOTE  Michele Dixon is a 22 y.o. G1P0000 at 2932w4d  admitted for SROM @1038  on 8/4  Subjective: Patient comfortable with epidural, asleep during contractions   Objective: BP (!) 94/49 Comment: pt on side  Pulse (!) 104   Temp 98 F (36.7 C) (Oral)   Resp 16   Ht 5\' 3"  (1.6 m)   Wt 168 lb (76.2 kg)   LMP 05/14/2017 Comment: late on LMP, pregnancy test negative  SpO2 97%   BMI 29.76 kg/m  or  Vitals:   02/22/18 1830 02/22/18 1900 02/22/18 2001 02/22/18 2133  BP: 110/69 111/73 118/83 (!) 94/49  Pulse: (!) 105 (!) 107 100 (!) 104  Resp: 18 18 16 16   Temp:   98 F (36.7 C)   TempSrc:   Oral   SpO2:      Weight:      Height:        Dilation: Lip/rim Effacement (%): 80 Cervical Position: Middle Station: 0, Plus 1 Presentation: Vertex Exam by:: Suzette BattiestVeronica, CNM FHT: baseline rate 125, moderate varibility, +acel, no decel Toco: 1.5-3  Labs: Lab Results  Component Value Date   WBC 14.7 (H) 02/22/2018   HGB 11.6 (L) 02/22/2018   HCT 33.7 (L) 02/22/2018   MCV 92.1 02/22/2018   PLT 124 (L) 02/22/2018    Patient Active Problem List   Diagnosis Date Noted  . Unwanted fertility 01/21/2018  . Medication exposure during first trimester of pregnancy 08/05/2017  . Encounter for supervision of normal first pregnancy in third trimester 08/05/2017  . Language barrier 08/05/2017  . Bilateral sensorineural hearing loss 06/07/2013  . 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 Migraine headache 07/05/2008  . 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 G1P0000 at 632w4d here for SROM @1038   Labor: Progressing well on pitocin, currently on 3810milli-units of pit.  Fetal Wellbeing:  Cat I Pain Control:  Epidural  Anticipated MOD:  SVD   Sharyon CableRogers, [place]don [place] C, CNM 02/22/2018, 9:44 PM

## 2018-02-23 ENCOUNTER — Encounter (HOSPITAL_COMMUNITY): Payer: Self-pay | Admitting: Anesthesiology

## 2018-02-23 ENCOUNTER — Encounter (HOSPITAL_COMMUNITY)
Admission: AD | Disposition: A | Discharge status: Home / Self Care | Payer: Self-pay | Source: Home / Self Care | Attending: Obstetrics & Gynecology

## 2018-02-23 ENCOUNTER — Telehealth: Payer: Self-pay | Admitting: Radiology

## 2018-02-23 ENCOUNTER — Encounter (HOSPITAL_COMMUNITY): Payer: Self-pay

## 2018-02-23 DIAGNOSIS — Z3A40 40 weeks gestation of pregnancy: Secondary | ICD-10-CM

## 2018-02-23 SURGERY — LIGATION, FALLOPIAN TUBE, POSTPARTUM
Anesthesia: Epidural | Laterality: Bilateral

## 2018-02-23 MED ORDER — SENNOSIDES-DOCUSATE SODIUM 8.6-50 MG PO TABS
2.0000 | ORAL_TABLET | ORAL | Status: DC
  Administered 2018-02-23 – 2018-02-24 (×2): 2 via ORAL
  Filled 2018-02-23 (×2): qty 2

## 2018-02-23 MED ORDER — ZOLPIDEM TARTRATE 5 MG PO TABS: 5.0000 mg | ORAL_TABLET | Freq: Every evening | ORAL | Status: DC | PRN

## 2018-02-23 MED ORDER — FAMOTIDINE 20 MG PO TABS: 40.0000 mg | ORAL_TABLET | Freq: Once | ORAL | Status: DC

## 2018-02-23 MED ORDER — ACETAMINOPHEN 325 MG PO TABS
650.0000 mg | ORAL_TABLET | ORAL | Status: DC | PRN
  Administered 2018-02-23 – 2018-02-25 (×2): 650 mg via ORAL
  Filled 2018-02-23 (×2): qty 2

## 2018-02-23 MED ORDER — ONDANSETRON HCL 4 MG PO TABS: 4.0000 mg | ORAL_TABLET | ORAL | Status: DC | PRN

## 2018-02-23 MED ORDER — DIBUCAINE 1 % RE OINT: 1.0000 "application " | TOPICAL_OINTMENT | RECTAL | Status: DC | PRN

## 2018-02-23 MED ORDER — ONDANSETRON HCL 4 MG/2ML IJ SOLN: 4.0000 mg | INTRAMUSCULAR | Status: DC | PRN

## 2018-02-23 MED ORDER — COCONUT OIL OIL: 1.0000 "application " | TOPICAL_OIL | Status: DC | PRN

## 2018-02-23 MED ORDER — DIPHENHYDRAMINE HCL 25 MG PO CAPS: 25.0000 mg | ORAL_CAPSULE | Freq: Four times a day (QID) | ORAL | Status: DC | PRN

## 2018-02-23 MED ORDER — LACTATED RINGERS IV SOLN: INTRAVENOUS | Status: DC

## 2018-02-23 MED ORDER — METOCLOPRAMIDE HCL 10 MG PO TABS: 10.0000 mg | ORAL_TABLET | Freq: Once | ORAL | Status: DC

## 2018-02-23 MED ORDER — TETANUS-DIPHTH-ACELL PERTUSSIS 5-2.5-18.5 LF-MCG/0.5 IM SUSP: 0.5000 mL | Freq: Once | INTRAMUSCULAR | Status: DC

## 2018-02-23 MED ORDER — IBUPROFEN 600 MG PO TABS
600.0000 mg | ORAL_TABLET | Freq: Four times a day (QID) | ORAL | Status: DC
  Administered 2018-02-23 – 2018-02-25 (×9): 600 mg via ORAL
  Filled 2018-02-23 (×10): qty 1

## 2018-02-23 MED ORDER — MISOPROSTOL 200 MCG PO TABS
ORAL_TABLET | ORAL | Status: AC
  Filled 2018-02-23: qty 2

## 2018-02-23 MED ORDER — WITCH HAZEL-GLYCERIN EX PADS: 1.0000 "application " | MEDICATED_PAD | CUTANEOUS | Status: DC | PRN

## 2018-02-23 MED ORDER — SIMETHICONE 80 MG PO CHEW: 80.0000 mg | CHEWABLE_TABLET | ORAL | Status: DC | PRN

## 2018-02-23 MED ORDER — MISOPROSTOL 200 MCG PO TABS
ORAL_TABLET | ORAL | Status: AC
  Filled 2018-02-23: qty 4

## 2018-02-23 MED ORDER — BENZOCAINE-MENTHOL 20-0.5 % EX AERO
1.0000 "application " | INHALATION_SPRAY | CUTANEOUS | Status: DC | PRN
  Administered 2018-02-23 – 2018-02-24 (×2): 1 via TOPICAL
  Filled 2018-02-23 (×2): qty 56

## 2018-02-23 MED ORDER — PRENATAL MULTIVITAMIN CH
1.0000 | ORAL_TABLET | Freq: Every day | ORAL | Status: DC
  Administered 2018-02-23 – 2018-02-25 (×3): 1 via ORAL
  Filled 2018-02-23 (×3): qty 1

## 2018-02-23 NOTE — Anesthesia Postprocedure Evaluation (Signed)
Anesthesia Post Note  Patient: Michele GreenlandJessica Christmas  Procedure(s) Performed: AN AD HOC LABOR EPIDURAL     Patient location during evaluation: Mother Baby Anesthesia Type: Epidural Level of consciousness: awake and alert and oriented Pain management: satisfactory to patient Vital Signs Assessment: post-procedure vital signs reviewed and stable Respiratory status: spontaneous breathing and nonlabored ventilation Cardiovascular status: stable Postop Assessment: no headache, no backache, no signs of nausea or vomiting, adequate PO intake and patient able to bend at knees (patient up walking) Anesthetic complications: no    Last Vitals:  Vitals:   02/23/18 0426 02/23/18 0642  BP: 111/77 111/77  Pulse: 91 90  Resp: 18   Temp: 37.3 C 37.3 C  SpO2:      Last Pain:  Vitals:   02/23/18 0642  TempSrc: Oral  PainSc:    Pain Goal: Patients Stated Pain Goal: 1 (02/22/18 1354)               Madison HickmanGREGORY,Sheriann Newmann

## 2018-02-23 NOTE — Anesthesia Preprocedure Evaluation (Deleted)
Anesthesia Evaluation  Patient identified by MRN, date of birth, ID band Patient awake    Reviewed: Allergy & Precautions, NPO status , Patient's Chart, lab work & pertinent test results  History of Anesthesia Complications Negative for: history of anesthetic complications  Airway Mallampati: II  TM Distance: >3 FB Neck ROM: Full    Dental   Pulmonary neg pulmonary ROS,    breath sounds clear to auscultation       Cardiovascular negative cardio ROS   Rhythm:Regular Rate:Normal     Neuro/Psych  Headaches,  Deaf Cochlear Implant Present  negative psych ROS   GI/Hepatic negative GI ROS, Neg liver ROS,   Endo/Other  negative endocrine ROS  Renal/GU negative Renal ROS  negative genitourinary   Musculoskeletal negative musculoskeletal ROS (+)   Abdominal   Peds  Hematology  (+) anemia ,  Thrombocytopenia    Anesthesia Other Findings   Reproductive/Obstetrics Desires sterilization post partum                             Anesthesia Physical  Anesthesia Plan  ASA: II  Anesthesia Plan: Epidural   Post-op Pain Management:    Induction:   PONV Risk Score and Plan: 2 and Treatment may vary due to age or medical condition, Ondansetron and Propofol infusion  Airway Management Planned: Natural Airway, Nasal Cannula and Simple Face Mask  Additional Equipment: None  Intra-op Plan:   Post-operative Plan:   Informed Consent: I have reviewed the patients History and Physical, chart, labs and discussed the procedure including the risks, benefits and alternatives for the proposed anesthesia with the patient or authorized representative who has indicated his/her understanding and acceptance.     Plan Discussed with: Anesthesiologist, CRNA and Surgeon  Anesthesia Plan Comments:         Anesthesia Quick Evaluation

## 2018-02-23 NOTE — Telephone Encounter (Signed)
Left message for patient to call [facility] to schedule postpartum appointment.

## 2018-02-23 NOTE — Plan of Care (Signed)
Patient ambulated to bathroom with partial assist. Notified oncoming RN that patient  may need assistance while walking to bathroom

## 2018-02-23 NOTE — Lactation Note (Signed)
This note was copied from a baby's chart. Lactation Consultation Note  Patient Name: Michele Maximino GreenlandJessica Bohac ZOXWR'UToday's Date: 02/23/2018 Reason for consult: Initial assessment;1st time breastfeeding;Term P1,5 hrs old female infant. [employer] sign language interpreter used #[id number] Per mom, she attended BF classes during her  pregnancy. [place]  Has DEBP at home. LC discussed STS and Mom had baby swaddle in blanket as LC entered room. Parents permission LC un-swaddle infant.   Mom latched infant  on left breast using a cross -cradle hold, LC had mom to tickle infant's top lip, infant open mouth wide and latched to breast with lower jaw extended downward. Audible swallowing was  heard by dad and LC. Latch -9  Per interpreter,  mom did not feel any pain at breast while infant is  latched. [facility], infant had feed for 15 mins.  Mom encouraged to feed baby 8-12 times/24 hours and with feeding cues.  LC discussed I& O with parents. Reviewed Baby & Me book's Breastfeeding Basics.  Mom made aware of O/P services, breastfeeding support groups, community resources, and our phone # for post-discharge questions.  Maternal Data Formula Feeding for Exclusion: No Has patient been taught Hand Expression?: Yes Does the patient have breastfeeding experience prior to this delivery?: No  Feeding Feeding Type: Breast Fed Length of feed: 15 min(Mom still BF as LC left room.)  LATCH Score Latch: Grasps breast easily, tongue down, lips flanged, rhythmical sucking.  Audible Swallowing: Spontaneous and intermittent  Type of Nipple: Everted at rest and after stimulation  Comfort (Breast/Nipple): Soft / non-tender  Hold (Positioning): Assistance needed to correctly position infant at breast and maintain latch.  LATCH Score: 9  Interventions Interventions: Breast feeding basics reviewed;Assisted with latch;Skin to skin;Breast massage;Support pillows;Adjust  position;Breast compression  Lactation Tools Discussed/Used WIC Program: Yes   Consult Status Consult Status: Follow-up Date: 02/23/18 Follow-up type: In-patient    Danelle EarthlyRobin Lariyah Shetterly 02/23/2018, 6:21 AM

## 2018-02-24 LAB — CBC
HCT: 25 % — ABNORMAL LOW (ref 36.0–46.0)
HEMOGLOBIN: 8.7 g/dL — AB (ref 12.0–15.0)
MCH: 32.7 pg (ref 26.0–34.0)
MCHC: 34.8 g/dL (ref 30.0–36.0)
MCV: 94 fL (ref 78.0–100.0)
Platelets: 139 10*3/uL — ABNORMAL LOW (ref 150–400)
RBC: 2.66 MIL/uL — ABNORMAL LOW (ref 3.87–5.11)
RDW: 13.8 % (ref 11.5–15.5)
WBC: 11.4 10*3/uL — ABNORMAL HIGH (ref 4.0–10.5)

## 2018-02-24 MED ORDER — SODIUM CHLORIDE 0.9 % IV SOLN
510.0000 mg | Freq: Once | INTRAVENOUS | Status: AC
  Administered 2018-02-24: 510 mg via INTRAVENOUS
  Filled 2018-02-24: qty 17

## 2018-02-24 NOTE — Progress Notes (Signed)
Patient reported to this nurse that she has noticed some shortness of breath with ambulating and mild blurry vision. Patient states she has noticed the blurry vision since delivery and only recalls SOB today. Patient has ambulated more frequently today. Patient has no other symptoms. Encouraged patient to rest in between visits to the NICU and increase fluid intake. Called Dr. Truddie Cocoell who stated she would assess patient. Encouraged patient to notify RN if symptoms became worse. Maudry Diegosborne, Javaughn Opdahl [place] Creek[place]udsp[place].

## 2018-02-24 NOTE — Progress Notes (Signed)
Post Partum Day 1  Subjective: up ad lib, voiding, tolerating PO and + flatus   Pt deaf.  All communication done with ASL via video interpreter.  Objective: Blood pressure 101/68, pulse 81, temperature 98.6 F (37 C), temperature source Oral, resp. rate 16, height 5\' 3"  (1.6 m), weight 76.2 kg (168 lb), last menstrual period 05/14/2017, SpO2 99 %, unknown if currently breastfeeding.  Physical Exam:  General: alert, cooperative and no distress Lochia: appropriate Uterine Fundus: firm Incision: healing well DVT Evaluation: No evidence of DVT seen on physical exam.  Recent Labs    02/22/18 0513  HGB 11.6*  HCT 33.7*    Assessment/Plan: Plan for discharge tomorrow   LOS: 2 days   Michele Dixon 02/24/2018, 7:46 AM

## 2018-02-24 NOTE — Plan of Care (Signed)
  Problem: Education: Goal: Knowledge of condition will improve Note:  Set patient up with DEBP since baby was sent to NICU. Educated patient and significant other on how to use, how often to pump and how to clean. Father interpreted this information. Offered interpreter; however, patient and significant other declined. Soon after education, Tresa EndoKelly, sign language interpreter from communication services arrived. Went in room with Tresa EndoKelly to check on patient to see if they had any questions or concerns. Parents declined need for interpreter while she was here. Tresa EndoKelly stayed until patient and significant other went to NICU in order for Tresa EndoKelly to interpret while parents in NICU. Encouraged patient and significant other to request interpreter at any time if they needed one. Earl Galasborne, Linda HedgesStefanie BorgerHudspeth

## 2018-02-24 NOTE — Progress Notes (Signed)
Called to room due to patient noting sob and dizziness since last night. States she notices she gets more sob when she walks, also dizzy. Denies fever, chills, worsening vaginal bleeding. States poor PO fluid intake.  Gen: alert, sitting up, eating fries Heart: RRR, no m/r/g Lungs: CTABL Skin: No pallor  - will repeat CBC today (was 11.6 on admission) - encourage PO fluid intake

## 2018-02-24 NOTE — Progress Notes (Signed)
CSW received consult for "Patient tried ot terminate pregnancy in first trimester, likely wants child now" - CSW did chart review and spoke with bedside RN. No concerns to be addressed at this time.   Please reconsult CSW if any needs arise or at patients request.   Stacy GardnerErin Avelyn Touch, LCSW Clinical Social Worker  [phone]

## 2018-02-24 NOTE — Lactation Note (Signed)
This note was copied from a baby's chart. Lactation Consultation Note  Patient Name: Michele Dixon NWGNF'AToday's Date: 02/24/2018 Reason for consult: Follow-up assessment;NICU baby [employer] video interpreter used for sign language.  Baby was transferred to the NICU and mom has pumped three times.  She has obtained drops and is anxious for her milk to come in.  Discussed milk coming to volume on the 3-5 day postpartum.  Mom understands how to use the pump and knows she should pump every 3 hours.  Syringe provided to use for collecting drops to put in vials.  Colostrum containers given.  Mom states she has a DEBP at home.  No further questions.  Maternal Data    Feeding Feeding Type: Formula  LATCH Score                   Interventions    Lactation Tools Discussed/Used Pump Review: Setup, frequency, and cleaning Initiated by:: RN Date initiated:: 02/24/18   Consult Status Consult Status: Follow-up Date: 02/25/18 Follow-up type: In-patient    Huston FoleyMOULDEN, Autymn Omlor S 02/24/2018, 3:55 PM

## 2018-02-25 ENCOUNTER — Encounter: Payer: Self-pay | Admitting: Certified Nurse Midwife

## 2018-02-25 ENCOUNTER — Other Ambulatory Visit: Payer: Self-pay

## 2018-02-25 DIAGNOSIS — O411290 Chorioamnionitis, unspecified trimester, not applicable or unspecified: Secondary | ICD-10-CM | POA: Insufficient documentation

## 2018-02-25 MED ORDER — IBUPROFEN 600 MG PO TABS: 600.0000 mg | ORAL_TABLET | Freq: Four times a day (QID) | ORAL | 0 refills | Status: DC

## 2018-02-25 NOTE — Lactation Note (Signed)
This note was copied from a baby's chart. Lactation Consultation Note  Patient Name: Michele Dixon AVWUJ'WToday's Date: 02/25/2018   [facility]  Interpreter Michele Dixon #[id number] used for ASL interpretation  P1 mother whose infant is now 5561 hours old.  Baby is in the NICU.  I wanted to follow up with mother to be sure she did not have any further questions/concerns related to pumping for her NICU infant.  Mother stated, "The doctor answered all her questions and I am good."  I reminded her to bring her pump parts when she visits the NICU and pump in our rooms or at baby's bedside.  Mother states her #27 flanges are an appropriate fit and I suggested she dispose of the small flanges.  She knows to use coconut oil for flange lubrication.  Mother will call LC when baby is allowed to go to breast for latch assistance if needed.  Colostrum containers provided per mother's request.  Father present.                 Amika Tassin R Omaree Fuqua 02/25/2018, 2:18 PM

## 2018-02-25 NOTE — Discharge Summary (Signed)
OB Discharge Summary     Patient Name: Michele GreenlandJessica Dixon DOB: 04-08-1996 MRN: [id number]  Date of admission: 02/22/2018 Delivering MD: Sharyon CableOGERS, VERONICA C   Date of discharge: 02/25/2018  Admitting diagnosis: 40 wks plus ctxs Intrauterine pregnancy: 7982w5d     Secondary diagnosis:  Principal Problem:   SVD (spontaneous vaginal delivery) Active Problems:   Encounter for supervision of normal first pregnancy in third trimester   Language barrier   Bilateral sensorineural hearing loss   Retained placenta w/o hemorrhage, delivered, current hospitalization   Chorioamnionitis  Additional problems: None     Discharge diagnosis: Term Pregnancy Delivered                                                                                                Post partum procedures:Manual removal of placenta  Augmentation: AROM and Pitocin  Complications: None  Hospital course:  Onset of Labor With Vaginal Delivery     22 y.o. yo G1P1001 at 7082w5d was admitted in Active Labor on 02/22/2018. Patient had an uncomplicated labor course as follows:  Membrane Rupture Time/Date: 4:54 AM ,02/22/2018   Intrapartum Procedures: Episiotomy: None [1]                                         Lacerations:  Periurethral [8]  Patient had a delivery of a Viable infant. 02/23/2018  Information for the patient's newborn:  Doyle Askewurvis, Boy Kambra [[id number]][[id number]]  Delivery Method: Vaginal, Spontaneous(Filed from Delivery Summary)    Pateint had an uncomplicated postpartum course.  She is ambulating, tolerating a regular diet, passing flatus, and urinating well. Patient is discharged home in stable condition on 02/25/18.   Physical exam  Vitals:   02/24/18 0730 02/24/18 1405 02/24/18 2324 02/25/18 0542  BP: 108/76 115/83 (!) 116/95 119/88  Pulse: 79 77 78 88  Resp: 16 16 18 18   Temp: 98 F (36.7 C) 97.7 F (36.5 C) 98.7 F (37.1 C) 98.3 F (36.8 C)  TempSrc: Oral Axillary Oral Oral  SpO2:      Weight:      Height:        General: alert, cooperative and no distress Lochia: appropriate Uterine Fundus: firm Incision: N/A DVT Evaluation: No evidence of DVT seen on physical exam. Labs: Lab Results  Component Value Date   WBC 11.4 (H) 02/24/2018   HGB 8.7 (L) 02/24/2018   HCT 25.0 (L) 02/24/2018   MCV 94.0 02/24/2018   PLT 139 (L) 02/24/2018   CMP Latest Ref Rng & Units 07/24/2017  Glucose 65 - 99 mg/dL 83  BUN 6 - 20 mg/dL 5(L)  Creatinine 2.130.44 - 1.00 mg/dL 0.86(V0.39(L)  Sodium 784135 - 696145 mmol/L 134(L)  Potassium 3.5 - 5.1 mmol/L 4.0  Chloride 101 - 111 mmol/L 106  CO2 22 - 32 mmol/L 21(L)  Calcium 8.9 - 10.3 mg/dL 8.9  Total Protein 6.5 - 8.1 g/dL 7.2  Total Bilirubin 0.3 - 1.2 mg/dL 0.6  Alkaline Phos 38 - 126 U/L 37(L)  AST 15 -  41 U/L 16  ALT 14 - 54 U/L 11(L)    Discharge instruction: per After Visit Summary and "Baby and Me Booklet".  After visit meds:  Scheduled Meds: . ibuprofen  600 mg Oral Q6H  . prenatal multivitamin  1 tablet Oral Q1200  . senna-docusate  2 tablet Oral Q24H   Continuous Infusions: . lactated ringers     PRN Meds:.acetaminophen, benzocaine-Menthol, coconut oil, witch hazel-glycerin **AND** dibucaine, diphenhydrAMINE, ondansetron **OR** ondansetron (ZOFRAN) IV, simethicone, zolpidem  Diet: routine diet  Activity: Advance as tolerated. Pelvic rest for 6 weeks.   Outpatient follow up:6 weeks Follow up Appt:No future appointments. Follow up Visit:No follow-ups on file.  Postpartum contraception: Had signed for BTL but declined after delivery.   Newborn Data: Live born female  Birth Weight: 8 lb 15.2 oz (4060 g) APGAR: 8, 9  Newborn Delivery   Birth date/time:  02/23/2018 01:15:00 Delivery type:  Vaginal, Spontaneous     Baby Feeding: Breast Disposition:NICU- for transient tachypnea of newborn   02/25/2018 Federico Flake, MD

## 2018-02-25 NOTE — Discharge Instructions (Signed)
Postpartum Care After Vaginal Delivery °The period of time right after you deliver your newborn is called the postpartum period. °What kind of medical care will I receive? °· You may continue to receive fluids and medicines through an IV tube inserted into one of your veins. °· If an incision was made near your vagina (episiotomy) or if you had some vaginal tearing during delivery, cold compresses may be placed on your episiotomy or your tear. This helps to reduce pain and swelling. °· You may be given a squirt bottle to use when you go to the bathroom. You may use this until you are comfortable wiping as usual. To use the squirt bottle, follow these steps: °? Before you urinate, fill the squirt bottle with warm water. Do not use hot water. °? After you urinate, while you are sitting on the toilet, use the squirt bottle to rinse the area around your urethra and vaginal opening. This rinses away any urine and blood. °? You may do this instead of wiping. As you start healing, you may use the squirt bottle before wiping yourself. Make sure to wipe gently. °? Fill the squirt bottle with clean water every time you use the bathroom. °· You will be given sanitary pads to wear. °How can I expect to feel? °· You may not feel the need to urinate for several hours after delivery. °· You will have some soreness and pain in your abdomen and vagina. °· If you are breastfeeding, you may have uterine contractions every time you breastfeed for up to several weeks postpartum. Uterine contractions help your uterus return to its normal size. °· It is normal to have vaginal bleeding (lochia) after delivery. The amount and appearance of lochia is often similar to a menstrual period in the first week after delivery. It will gradually decrease over the next few weeks to a dry, yellow-brown discharge. For most women, lochia stops completely by 6-8 weeks after delivery. Vaginal bleeding can vary from woman to woman. °· Within the first few  days after delivery, you may have breast engorgement. This is when your breasts feel heavy, full, and uncomfortable. Your breasts may also throb and feel hard, tightly stretched, warm, and tender. After this occurs, you may have milk leaking from your breasts. Your health care provider can help you relieve discomfort due to breast engorgement. Breast engorgement should go away within a few days. °· You may feel more sad or worried than normal due to hormonal changes after delivery. These feelings should not last more than a few days. If these feelings do not go away after several days, speak with your health care provider. °How should I care for myself? °· Tell your health care provider if you have pain or discomfort. °· Drink enough water to keep your urine clear or pale yellow. °· Wash your hands thoroughly with soap and water for at least 20 seconds after changing your sanitary pads, after using the toilet, and before holding or feeding your baby. °· If you are not breastfeeding, avoid touching your breasts a lot. Doing this can make your breasts produce more milk. °· If you become weak or lightheaded, or you feel like you might faint, ask for help before: °? Getting out of bed. °? Showering. °· Change your sanitary pads frequently. Watch for any changes in your flow, such as a sudden increase in volume, a change in color, the passing of large blood clots. If you pass a blood clot from your vagina, save it   to show to your health care provider. Do not flush blood clots down the toilet without having your health care provider look at them. °· Make sure that all your vaccinations are up to date. This can help protect you and your baby from getting certain diseases. You may need to have immunizations done before you leave the hospital. °· If desired, talk with your health care provider about methods of family planning or birth control (contraception). °How can I start bonding with my baby? °Spending as much time as  possible with your baby is very important. During this time, you and your baby can get to know each other and develop a bond. Having your baby stay with you in your room (rooming in) can give you time to get to know your baby. Rooming in can also help you become comfortable caring for your baby. Breastfeeding can also help you bond with your baby. °How can I plan for returning home with my baby? °· Make sure that you have a car seat installed in your vehicle. °? Your car seat should be checked by a certified car seat installer to make sure that it is installed safely. °? Make sure that your baby fits into the car seat safely. °· Ask your health care provider any questions you have about caring for yourself or your baby. Make sure that you are able to contact your health care provider with any questions after leaving the hospital. °This information is not intended to replace advice given to you by your health care provider. Make sure you discuss any questions you have with your health care provider. °Document Released: 05/05/2007 Document Revised: 12/11/2015 Document Reviewed: 06/12/2015 °Elsevier Interactive Patient Education © 2018 [employer] ° °

## 2018-02-25 NOTE — Progress Notes (Signed)
Pt's sister called out approx. 0030 and stated pt wanted to have a BTL scheduled for later in the morning. This RN went in to inform patient that she needed to speak with her MD about such request and she was sleeping. During morning rounds this RN used pt's significant other to inform pt I would be bringing in the video interpreter to get clarification on BTL request. Pt signed through partner that she didn't need the video and that she wanted to tell this RN that she changed her mind about wanting to request a BTL.

## 2018-02-27 ENCOUNTER — Other Ambulatory Visit: Payer: Self-pay

## 2018-03-02 ENCOUNTER — Inpatient Hospital Stay (HOSPITAL_COMMUNITY): Admission: RE | Admit: 2018-03-02 | Payer: Medicaid Other | Source: Ambulatory Visit

## 2018-04-01 ENCOUNTER — Encounter: Payer: Self-pay | Admitting: Family Medicine

## 2018-04-01 ENCOUNTER — Ambulatory Visit (INDEPENDENT_AMBULATORY_CARE_PROVIDER_SITE_OTHER): Payer: Medicare Other | Admitting: Family Medicine

## 2018-04-01 ENCOUNTER — Encounter: Payer: Self-pay | Admitting: Radiology

## 2018-04-01 DIAGNOSIS — Z975 Presence of (intrauterine) contraceptive device: Secondary | ICD-10-CM | POA: Insufficient documentation

## 2018-04-01 DIAGNOSIS — Z30430 Encounter for insertion of intrauterine contraceptive device: Secondary | ICD-10-CM | POA: Diagnosis not present

## 2018-04-01 DIAGNOSIS — Z1389 Encounter for screening for other disorder: Secondary | ICD-10-CM | POA: Diagnosis not present

## 2018-04-01 DIAGNOSIS — Z3202 Encounter for pregnancy test, result negative: Secondary | ICD-10-CM | POA: Diagnosis not present

## 2018-04-01 LAB — POCT URINE PREGNANCY: PREG TEST UR: POSITIVE — AB

## 2018-04-01 MED ORDER — LEVONORGESTREL 19.5 MCG/DAY IU IUD
INTRAUTERINE_SYSTEM | Freq: Once | INTRAUTERINE | Status: AC
  Administered 2018-04-01: 17:00:00 via INTRAUTERINE

## 2018-04-01 MED ORDER — LEVONORGESTREL 19.5 MCG/DAY IU IUD: 1.0000 | INTRAUTERINE_SYSTEM | Freq: Once | INTRAUTERINE | 0 refills | Status: DC

## 2018-04-01 NOTE — Progress Notes (Addendum)
Post Partum Exam  Michele Dixon is a 22 y.o. G45P1001 female who presents for a postpartum visit. She is five weeks postpartum following a  Vaginal delivery. I have fully reviewed the prenatal and intrapartum course. The delivery was at  gestational weeks.  Anesthesia: epidural. Postpartum course has been uncomplicated. Baby's course has been uncomplicated Baby is feeding by bottle/breast. Bleeding yes. Bowel function is normal. Bladder function is normal. Patient is not sexually active. Contraception method is nothing, but desires IUD. Postpartum depression screening:negative  The following portions of the patient's history were reviewed and updated as appropriate: allergies, current medications, past family history, past medical history, past social history, past surgical history and problem list. Last pap smear done 09/06/2015 normal  Review of Systems   Objective:  unknown if currently breastfeeding.  General:  alert, cooperative and appears stated age   Breasts:  inspection negative, no nipple discharge or bleeding, no masses or nodularity palpable  Lungs: clear to auscultation bilaterally  Heart:  regular rate and rhythm, S1, S2 normal, no murmur, click, rub or gallop  Abdomen: soft, non-tender; bowel sounds normal; no masses,  no organomegaly   Vulva:  normal  Vagina: normal vagina  Cervix:  multiparous appearance  Corpus: normal  Adnexa:  normal adnexa  Rectal Exam: Not performed.          IUD Insertion Procedure Note Patient identified, informed consent performed, consent signed.   Discussed risks of irregular bleeding, cramping, infection, malpositioning or misplacement of the IUD outside the uterus which may require further procedure such as laparoscopy. Time out was performed.  Urine pregnancy test negative.  Speculum placed in the vagina.  Cervix visualized.  Cleaned with Betadine x 2.  Grasped anteriorly with a single tooth tenaculum.  Uterus sounded to 8 cm.  LNG IUD  placed per manufacturer's recommendations.  Strings trimmed to 3 cm. Tenaculum was removed, good hemostasis noted.  Patient tolerated procedure well.   Patient was given post-procedure instructions.  She was advised to have backup contraception for one week.  Patient was also asked to check IUD strings periodically and follow up in 4 weeks for IUD check.    Assessment:    Normal postpartum exam. Pap smear not done at today's visit.   Plan:   1. Contraception: IUD- placed today 2. Mood - stable. Reviewed mood changes and when to seek care.  3. Infant feeding  - 20 oz of BM daily, only 3-4 oz of formula when out of the house. Pumps while baby sleeps and mostly feeding at the breast now.  4. Chronic Medical Conditions-  Migraines: patient selected progesterone only IUD which is safe with this condition and she will follow up with PCP for any worsening symptoms.   Hearing Loss/Cochlear implants: Transitioning well to motherhood. Encouraged patient to reach if needed. Recommend pediatrician follow up with mother and possible [facility] referral if needed   Follow up in: 3-4 weeks for string check if unable to feel strings.

## 2018-04-28 ENCOUNTER — Encounter: Payer: Self-pay | Admitting: Obstetrics and Gynecology

## 2018-04-28 ENCOUNTER — Other Ambulatory Visit (HOSPITAL_COMMUNITY)
Admission: RE | Admit: 2018-04-28 | Discharge: 2018-04-28 | Disposition: A | Discharge status: Home / Self Care | Payer: Medicare Other | Source: Ambulatory Visit | Attending: Obstetrics and Gynecology | Admitting: Obstetrics and Gynecology

## 2018-04-28 ENCOUNTER — Ambulatory Visit (INDEPENDENT_AMBULATORY_CARE_PROVIDER_SITE_OTHER): Payer: Medicare Other | Admitting: Obstetrics and Gynecology

## 2018-04-28 VITALS — BP 126/77 | HR 90 | Resp 18 | Ht 63.0 in | Wt 146.2 lb

## 2018-04-28 DIAGNOSIS — N898 Other specified noninflammatory disorders of vagina: Secondary | ICD-10-CM | POA: Insufficient documentation

## 2018-04-28 MED ORDER — METRONIDAZOLE 0.75 % VA GEL: 1.0000 | Freq: Every day | VAGINAL | 1 refills | Status: AC

## 2018-04-28 NOTE — Progress Notes (Signed)
Obstetrics and Gynecology Visit Return Patient Evaluation  Appointment Date: 04/28/2018  Primary Care Provider: Patient, No Pcp Per  OBGYN Clinic: [facility]  Chief Complaint: vaginal smell  History of Present Illness:  Michele Dixon is a 22 y.o. s/p 9/11 LNG IUD placement at her PP visit. She states that about a week after placement she noticed a smell and occasional itching, ? Discharge. No VB or spotting or pain.   Review of Systems:  as noted in the History of Present Illness.  Medications:  Michele Dixon had no medications administered during this visit. Current Outpatient Medications  Medication Sig Dispense Refill  . Levonorgestrel (LILETTA, 52 MG,) 19.5 MCG/DAY IUD IUD 1 each by Intrauterine route once.    . Prenatal Vit-Fe Fumarate-FA (PRENATAL VITAMIN PO) Take 1 tablet by mouth daily.      No current facility-administered medications for this visit.     Allergies: has No Known Allergies.  Physical Exam:  BP 126/77 (BP Location: Left Arm, Patient Position: Sitting, Cuff Size: Normal)   Pulse 90   Resp 18   Ht 5\' 3"  (1.6 m)   Wt 146 lb 3.2 oz (66.3 kg)   Breastfeeding? Yes   BMI 25.90 kg/m  Body mass index is 25.9 kg/m. General appearance: Well nourished, well developed female in no acute distress.  Abdomen: diffusely non tender to palpation, non distended, and no masses, hernias Neuro/Psych:  Normal mood and affect.    Pelvic exam:  EGBUS: normal Vagina: no VB, slightly white yellow d/c Cervix: normal: blue iud strings approx 4cm tucked into fornices   Assessment: likely BV  Plan:  1. Foul smelling vaginal discharge Flagyl for now and f/u swab testing (pt desired) and treat prn. Will do metrogel since breastfeeding - Cervicovaginal ancillary only   RTC: PRN  Cornelia Copa MD Attending [facility])

## 2018-04-29 LAB — CERVICOVAGINAL ANCILLARY ONLY
BACTERIAL VAGINITIS: POSITIVE — AB
Candida vaginitis: NEGATIVE
Chlamydia: NEGATIVE
Neisseria Gonorrhea: NEGATIVE
Trichomonas: NEGATIVE

## 2018-05-06 ENCOUNTER — Telehealth: Payer: Self-pay | Admitting: Radiology

## 2018-05-06 ENCOUNTER — Other Ambulatory Visit: Payer: Self-pay

## 2018-05-06 NOTE — Telephone Encounter (Signed)
Patient is positive for BV and has been made aware of results.  Will treat her with flagyl.

## 2018-05-26 DIAGNOSIS — H903 Sensorineural hearing loss, bilateral: Secondary | ICD-10-CM | POA: Diagnosis not present

## 2018-06-27 IMAGING — US US MFM OB DETAIL+14 WK
1 series · 13 of 28 positions shown · non-contrast
Comparison: none

[Series 1: us mfm ob detail+14 wk · 67 acquisitions, 13 frames shown]
[im 3/67]
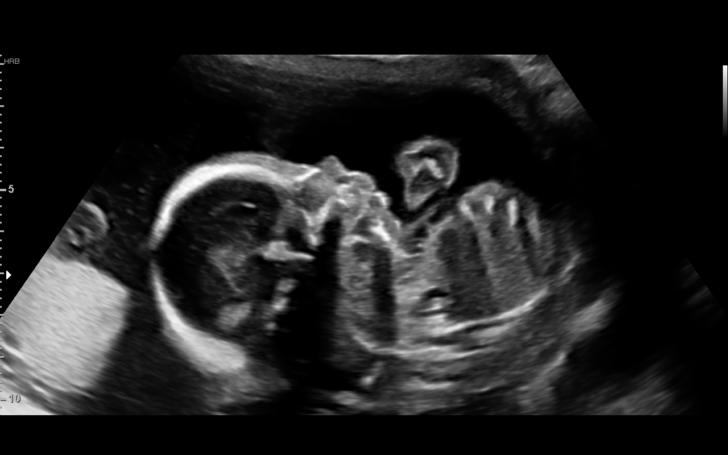
[im 8/67]
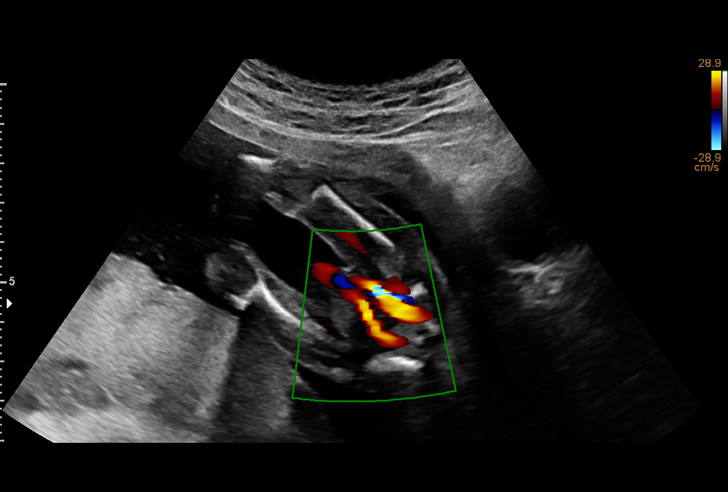
[im 13/67]
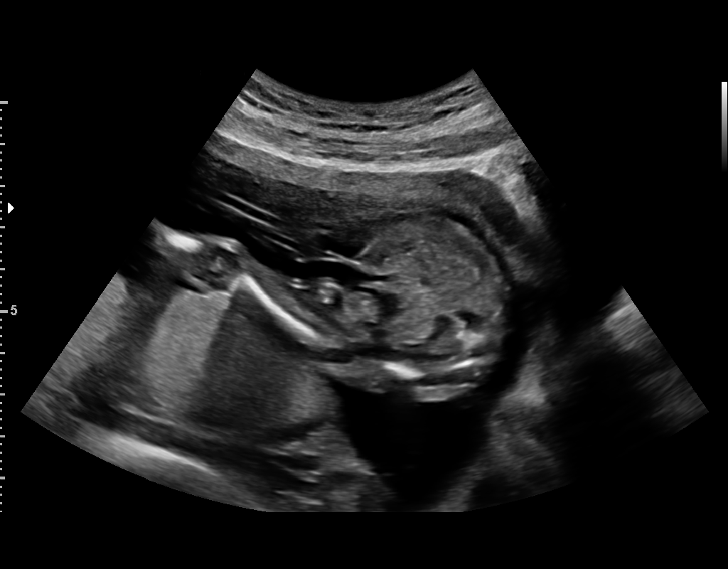
[im 18/67]
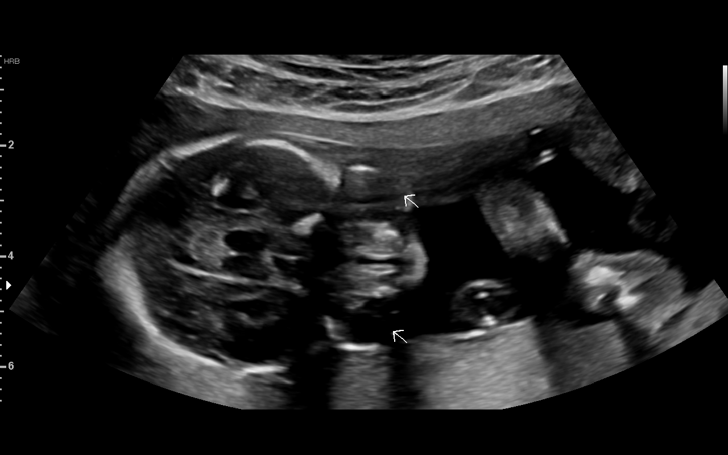
[im 23/67]
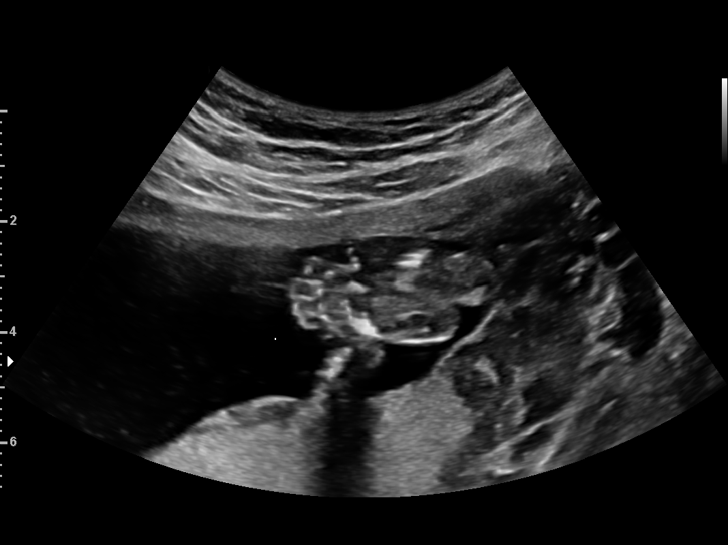
[im 27/67]
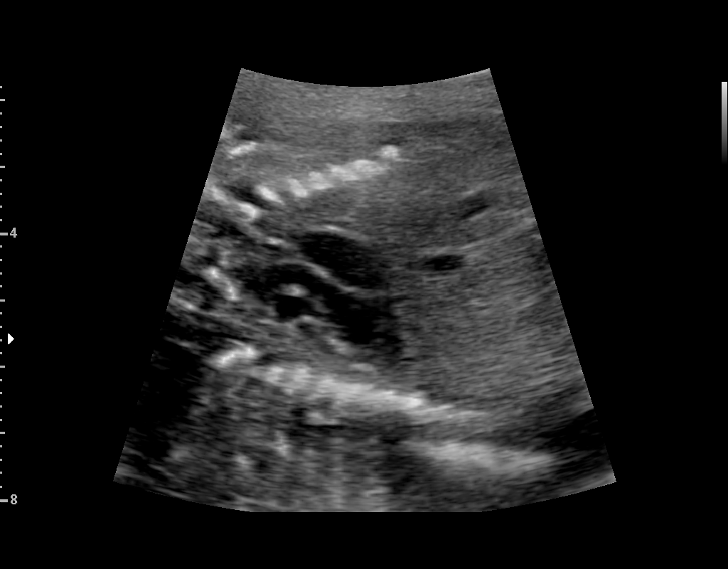
[im 35/67]
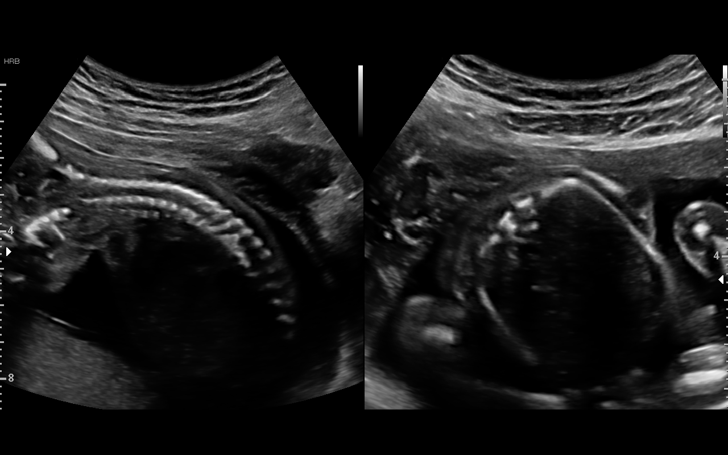
[im 40/67]
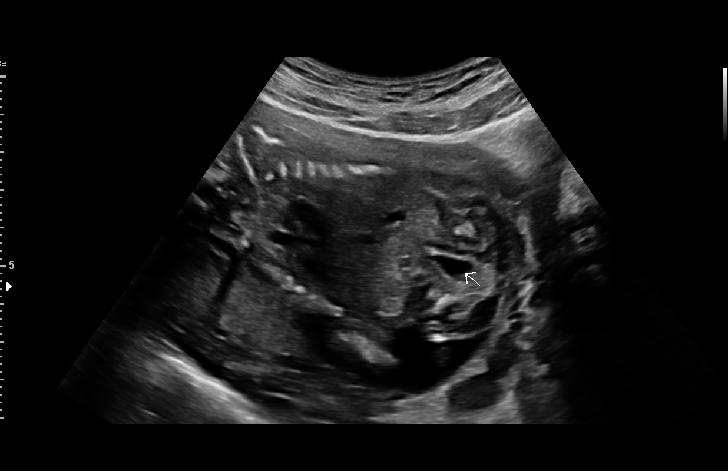
[im 45/67]
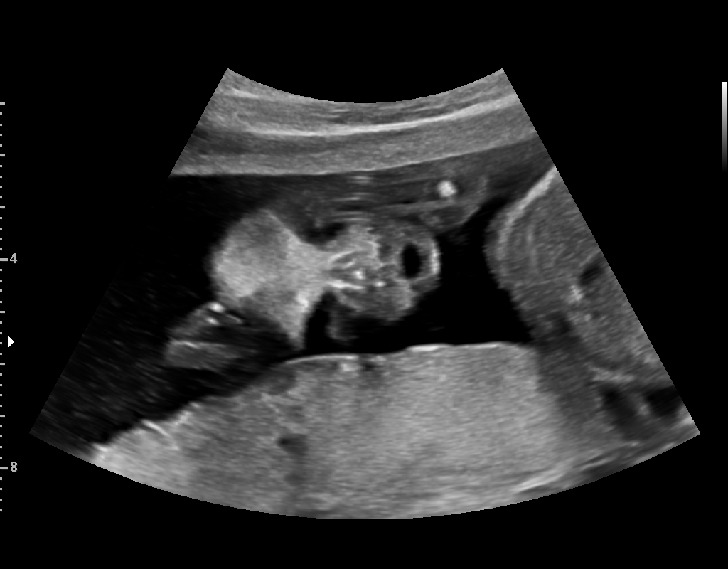
[im 49/67]
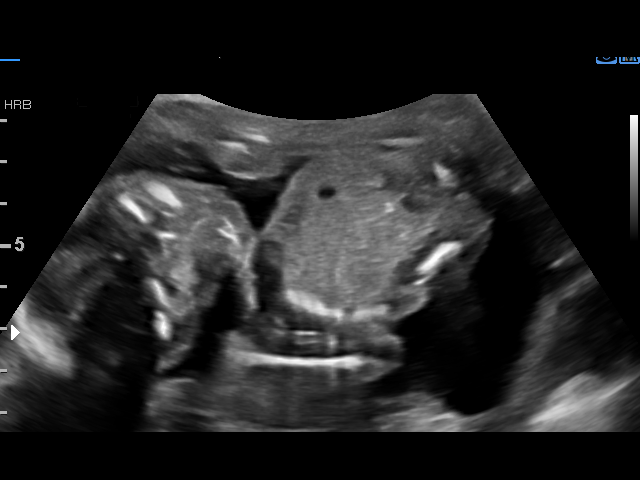
[im 54/67]
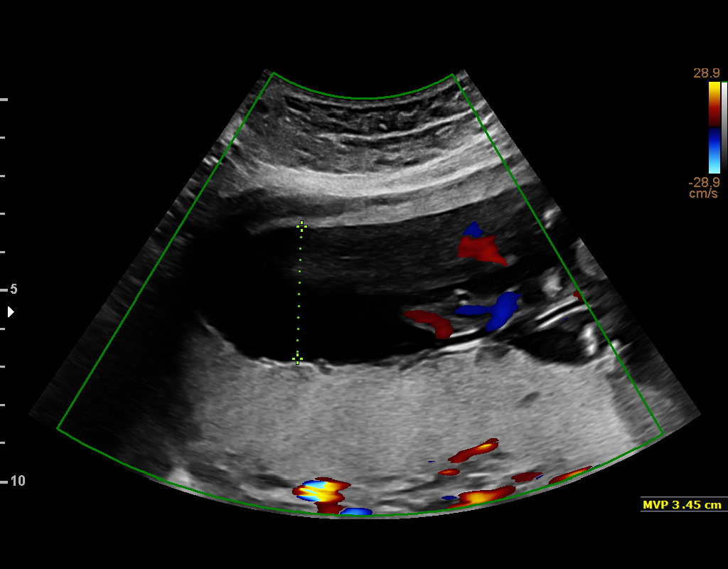
[im 59/67]
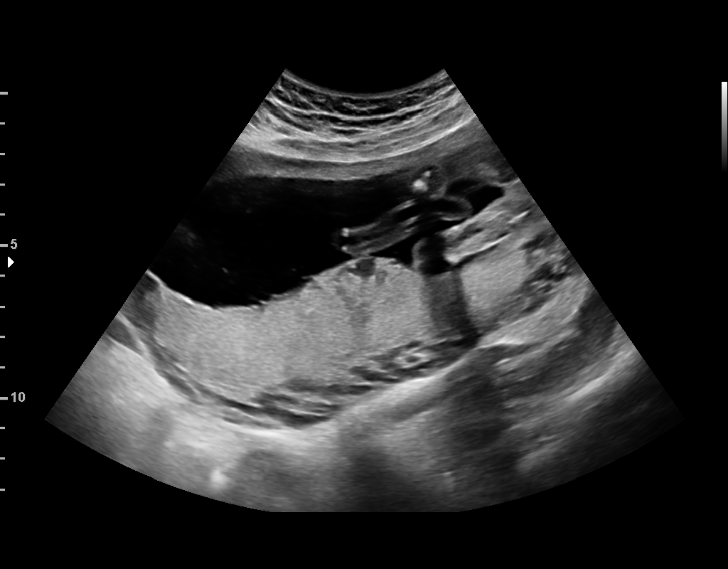
[im 64/67]
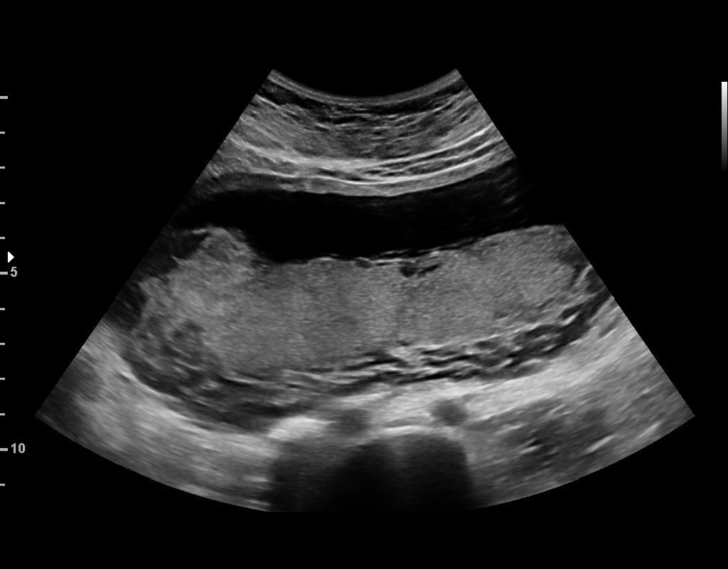

[13 of 28 positions shown; findings below may reference images not displayed]

1  ANTONIO MENDES NORIS          [id number]      [id number]     [id number]
Indications

18 weeks gestation of pregnancy
Medication exposure during first trimester of
pregnancy (misoprostol, valium)
Encounter for fetal anatomic survey
OB History

Blood Type:            Height:  5'3"   Weight (lb):  135       BMI:
Gravidity:    1
Fetal Evaluation

Num Of Fetuses:     1
Fetal Heart         155
Rate(bpm):
Cardiac Activity:   Observed
Presentation:       Oblique Breech
Placenta:           Posterior, above cervical os
P. Cord Insertion:  Visualized, central

Amniotic Fluid
AFI FV:      Subjectively within normal limits

Largest Pocket(cm)
3.45
Biometry

BPD:        43  mm     G. Age:  19w 0d         58  %    CI:         71.3   %    70 - 86
FL/HC:      18.7   %    16.1 -
HC:      162.2  mm     G. Age:  19w 0d         50  %    HC/AC:      1.05        1.09 -
AC:      153.9  mm     G. Age:  20w 4d         91  %    FL/BPD:     70.7   %
FL:       30.4  mm     G. Age:  19w 3d         63  %    FL/AC:      19.8   %    20 - 24
CER:      20.6  mm     G. Age:  19w 4d         69  %
NFT:       5.1  mm

CM:          4  mm

Est. FW:     320  gm    0 lb 11 oz      61  %
Gestational Age

LMP:           18w 6d        Date:  05/14/17                 EDD:   02/18/18
U/S Today:     19w 4d                                        EDD:   02/13/18
Best:          18w 6d     Det. By:  LMP  (05/14/17)          EDD:   02/18/18
Anatomy

Cranium:               Appears normal         Aortic Arch:            Not well visualized
Cavum:                 Appears normal         Ductal Arch:            Not well visualized
Ventricles:            Appears normal         Diaphragm:              Appears normal
Choroid Plexus:        Appears normal         Stomach:                Appears normal, left
sided
Cerebellum:            Appears normal         Abdomen:                Appears normal
Posterior Fossa:       Appears normal         Abdominal Wall:         Appears nml (cord
insert, abd wall)
Nuchal Fold:           Appears normal         Cord Vessels:           Appears normal (3
vessel cord)
Face:                  Orbits nl; profile not Kidneys:                Visualized
well visualized
Lips:                  Appears normal         Bladder:                Appears normal
Thoracic:              Appears normal         Spine:                  Not well visualized
Heart:                 Appears normal         Upper Extremities:      Appears normal
(4CH, axis, and situs
RVOT:                  Appears normal         Lower Extremities:      Appears normal
LVOT:                  Appears normal

Other:  Male gender. Heels and 5th digit visualized. Nasal bone visualized.
Open hands visualized.
Cervix Uterus Adnexa

Cervix
Length:           3.43  cm.
Normal appearance by transabdominal scan.

Uterus
No abnormality visualized.

Cul De Sac:   No free fluid seen.

Adnexa:       No abnormality visualized.
Impression

Single living intrauterine pregnancy at 18w 6d.
Placenta Posterior, above cervical os.
Appropriate fetal growth.
Normal amniotic fluid volume.
The fetal anatomic survey is not complete secondary to fetal
osition
No gross fetal anomalies identified.
The cervix measures 3.43cm transabdominally without
funneling.
The adnexa appear normal bilaterally without masses.
Recommendations

Recommend follow-up ultrasound examination in 
 4 weeks

## 2018-12-17 ENCOUNTER — Other Ambulatory Visit: Payer: Self-pay

## 2018-12-17 ENCOUNTER — Ambulatory Visit (INDEPENDENT_AMBULATORY_CARE_PROVIDER_SITE_OTHER): Payer: Medicare Other | Admitting: Obstetrics & Gynecology

## 2018-12-17 ENCOUNTER — Encounter: Payer: Self-pay | Admitting: Obstetrics & Gynecology

## 2018-12-17 VITALS — BP 124/87 | HR 75 | Wt 166.5 lb

## 2018-12-17 DIAGNOSIS — R102 Pelvic and perineal pain: Secondary | ICD-10-CM | POA: Diagnosis not present

## 2018-12-17 MED ORDER — IBUPROFEN 600 MG PO TABS: 600.0000 mg | ORAL_TABLET | Freq: Four times a day (QID) | ORAL | 1 refills | Status: DC | PRN

## 2018-12-17 NOTE — Progress Notes (Signed)
Pt is in the office for GYN visit. Pt complains of L sided pelvic pain near her ovaries. Pt concerned that pain could be related to IUD, device placed 04-01-18. Last pap 09-06-2015.

## 2018-12-17 NOTE — Progress Notes (Signed)
Patient ID: Michele Dixon, female   DOB: January 23, 1996, 23 y.o.   MRN: [id number]  Chief Complaint  Patient presents with  . Gyn  LLQ pain one week  HPI Michele Dixon is a 23 y.o. female.  G1P1001 No LMP recorded. (Menstrual status: IUD). IUD was placed 04/2018. For one week she had LLQ pain with exercise, improves some with Tylenol. HPI  Past Medical History:  Diagnosis Date  . Cochlear implant in place    age 44  . Exudative pharyngitis 01/01/2013  . FUO (fever of unknown origin) 01/01/2013  . Infection    UTI  . Migraines   . Ovarian cyst    ~6th grade  . Sensorineural hearing loss (SNHL) of both ears    can read lips but unsure about medical terms  . Syncope and collapse 01/01/2013    Past Surgical History:  Procedure Laterality Date  . COCHLEAR IMPLANT Right age 22  . FOREIGN BODY REMOVAL Left 10/29/2016   Procedure: EXCISION FOREIGN BODY LEFT ARM;  Surgeon: De Blanch Kinsinger, MD;  Location: [facility];  Service: General;  Laterality: Left;  . NORPLANT REMOVAL Left 05/15/2016   Procedure: Attempted REMOVAL OF Nexplanon;  Surgeon: Allie Bossier, MD;  Location: [facility];  Service: Gynecology;  Laterality: Left;  Left upper arm.    Family History  Problem Relation Age of Onset  . Hypertension Mother   . Hyperlipidemia Father   . Prostate cancer Father   . Pancreatitis Sister   . Pancreatitis Maternal Aunt   . Pancreatitis Maternal Grandmother     Social History Social History   Tobacco Use  . Smoking status: Never Smoker  . Smokeless tobacco: Never Used  Substance Use Topics  . Alcohol use: Not Currently    Comment: occasional  . Drug use: No    No Known Allergies  Current Outpatient Medications  Medication Sig Dispense Refill  . Levonorgestrel (LILETTA, 52 MG,) 19.5 MCG/DAY IUD IUD 1 each by Intrauterine route once.    Marland Kitchen ibuprofen (ADVIL) 600 MG tablet Take 1 tablet (600 mg total) by mouth every 6 (six) hours as needed. 30 tablet 1  .  Prenatal Vit-Fe Fumarate-FA (PRENATAL VITAMIN PO) Take 1 tablet by mouth daily.      No current facility-administered medications for this visit.     Review of Systems Review of Systems  Constitutional: Negative.   Respiratory: Negative.   Cardiovascular: Negative.   Gastrointestinal: Positive for abdominal pain.  Genitourinary: Positive for frequency and pelvic pain. Negative for difficulty urinating, dyspareunia, hematuria and menstrual problem.    Blood pressure 124/87, pulse 75, weight 166 lb 8 oz (75.5 kg), currently breastfeeding.  Physical Exam Physical Exam Vitals signs and nursing note reviewed.  Constitutional:      Appearance: Normal appearance.  Abdominal:     General: Abdomen is flat.     Palpations: Abdomen is soft.  Genitourinary:    General: Normal vulva.     Vagina: No vaginal discharge.     Comments: String 3-4 cm at os, no CMT, no mass or tenderness Skin:    General: Skin is warm and dry.  Neurological:     General: No focal deficit present.     Mental Status: She is alert and oriented to person, place, and time.     Data Reviewed Office notes  Assessment LLQ pelvic pain, possibly MS, pelvic exam is benign IUD in place  Plan Pelvic US ordered Ibuprofen prn RTV if sx do not  improve    Scheryl DarterJames Rome Echavarria 12/17/2018, 10:08 AM

## 2018-12-17 NOTE — Patient Instructions (Signed)

## 2018-12-29 ENCOUNTER — Ambulatory Visit (HOSPITAL_COMMUNITY): Payer: Medicare Other | Attending: Obstetrics & Gynecology

## 2019-01-05 NOTE — Telephone Encounter (Signed)
ERROR

## 2019-10-11 ENCOUNTER — Other Ambulatory Visit (HOSPITAL_COMMUNITY)
Admission: RE | Admit: 2019-10-11 | Discharge: 2019-10-11 | Disposition: A | Discharge status: Home / Self Care | Payer: Medicare (Managed Care) | Source: Ambulatory Visit | Attending: Obstetrics & Gynecology | Admitting: Obstetrics & Gynecology

## 2019-10-11 ENCOUNTER — Encounter: Payer: Self-pay | Admitting: Obstetrics & Gynecology

## 2019-10-11 ENCOUNTER — Other Ambulatory Visit: Payer: Self-pay

## 2019-10-11 ENCOUNTER — Ambulatory Visit (INDEPENDENT_AMBULATORY_CARE_PROVIDER_SITE_OTHER): Payer: Medicare (Managed Care) | Admitting: Obstetrics & Gynecology

## 2019-10-11 VITALS — BP 119/79 | HR 96 | Wt 158.7 lb

## 2019-10-11 DIAGNOSIS — R8290 Unspecified abnormal findings in urine: Secondary | ICD-10-CM | POA: Diagnosis not present

## 2019-10-11 DIAGNOSIS — Z30431 Encounter for routine checking of intrauterine contraceptive device: Secondary | ICD-10-CM | POA: Diagnosis not present

## 2019-10-11 DIAGNOSIS — M545 Low back pain, unspecified: Secondary | ICD-10-CM

## 2019-10-11 DIAGNOSIS — Z01419 Encounter for gynecological examination (general) (routine) without abnormal findings: Secondary | ICD-10-CM

## 2019-10-11 LAB — POCT URINALYSIS DIPSTICK
Bilirubin, UA: NEGATIVE
Glucose, UA: NEGATIVE
Ketones, UA: NEGATIVE
Leukocytes, UA: NEGATIVE
Nitrite, UA: NEGATIVE
Protein, UA: NEGATIVE
Spec Grav, UA: 1.015 (ref 1.010–1.025)
Urobilinogen, UA: 0.2 E.U./dL
pH, UA: 7 (ref 5.0–8.0)

## 2019-10-11 NOTE — Progress Notes (Signed)
GYNECOLOGY ANNUAL PREVENTATIVE CARE ENCOUNTER NOTE  History:     Michele Dixon is a 24 y.o. G20P1001 female here for a routine annual gynecologic exam.  She uses sign language to communicate, ASL interpreter present for this encounter.  Current complaints: upper abdominal pain attributed to GERD, and bilateral episodic low back pain, none currently.  No dysuria, no fevers.   Denies abnormal vaginal bleeding, discharge, pelvic pain, problems with intercourse or other gynecologic concerns.    Gynecologic History No LMP recorded. (Menstrual status: IUD). Contraception: Liletta IUD placed 04/01/2018, no concerns. Last Pap: 09/06/2015. Results were: normal.   Obstetric History OB History  Gravida Para Term Preterm AB Living  1 1 1  0 0 1  SAB TAB Ectopic Multiple Live Births  0 0 0 0 1    # Outcome Date GA Lbr Len/2nd Weight Sex Delivery Anes PTL Lv  1 Term 02/23/18 [redacted]w[redacted]d 31:09 / 02:51 8 lb 15.2 oz (4.06 kg) M Vag-Spont EPI  LIV     Birth Comments: WNL    Past Medical History:  Diagnosis Date  . Cochlear implant in place    age 41  . Exudative pharyngitis 01/01/2013  . FUO (fever of unknown origin) 01/01/2013  . Infection    UTI  . Migraines   . Ovarian cyst    ~6th grade  . Sensorineural hearing loss (SNHL) of both ears    can read lips but unsure about medical terms  . Syncope and collapse 01/01/2013    Past Surgical History:  Procedure Laterality Date  . COCHLEAR IMPLANT Right age 33  . FOREIGN BODY REMOVAL Left 10/29/2016   Procedure: EXCISION FOREIGN BODY LEFT ARM;  Surgeon: 12/29/2016 Kinsinger, MD;  Location: [facility];  Service: General;  Laterality: Left;  . NORPLANT REMOVAL Left 05/15/2016   Procedure: Attempted REMOVAL OF Nexplanon;  Surgeon: 05/17/2016, MD;  Location: [facility];  Service: Gynecology;  Laterality: Left;  Left upper arm.    Current Outpatient Medications on File Prior to Visit  Medication Sig Dispense Refill  . Levonorgestrel  (LILETTA, 52 MG,) 19.5 MCG/DAY IUD IUD 1 each by Intrauterine route once.    Allie Bossier ibuprofen (ADVIL) 600 MG tablet Take 1 tablet (600 mg total) by mouth every 6 (six) hours as needed. (Patient not taking: Reported on 10/11/2019) 30 tablet 1  . Prenatal Vit-Fe Fumarate-FA (PRENATAL VITAMIN PO) Take 1 tablet by mouth daily.      No current facility-administered medications on file prior to visit.    No Known Allergies  Social History:  reports that she has never smoked. She has never used smokeless tobacco. She reports previous alcohol use. She reports that she does not use drugs.  Family History  Problem Relation Age of Onset  . Hypertension Mother   . Hyperlipidemia Father   . Prostate cancer Father   . Pancreatitis Sister   . Pancreatitis Maternal Aunt   . Breast cancer Maternal Aunt   . Pancreatitis Maternal Grandmother     The following portions of the patient's history were reviewed and updated as appropriate: allergies, current medications, past family history, past medical history, past social history, past surgical history and problem list.  Review of Systems Pertinent items noted in HPI and remainder of comprehensive ROS otherwise negative.  Physical Exam:  BP 119/79   Pulse 96   Wt 158 lb 11.2 oz (72 kg)   Breastfeeding No   BMI 28.11 kg/m  CONSTITUTIONAL: Well-developed, well-nourished female  in no acute distress.  HENT:  Normocephalic, atraumatic, External right and left ear normal. Oropharynx is clear and moist EYES: Conjunctivae and EOM are normal. Pupils are equal, round, and reactive to light. No scleral icterus.  NECK: Normal range of motion, supple, no masses.  Normal thyroid.  SKIN: Skin is warm and dry. No rash noted. Not diaphoretic. No erythema. No pallor. MUSCULOSKELETAL: Normal range of motion. No tenderness.  No cyanosis, clubbing, or edema.  2+ distal pulses. NEUROLOGIC: Alert and oriented to person, place, and time. Normal reflexes, muscle tone  coordination.  PSYCHIATRIC: Normal mood and affect. Normal behavior. Normal judgment and thought content. CARDIOVASCULAR: Normal heart rate noted, regular rhythm RESPIRATORY: Clear to auscultation bilaterally. Effort and breath sounds normal, no problems with respiration noted. BREASTS: Symmetric in size. No masses, tenderness, skin changes, nipple drainage, or lymphadenopathy bilaterally. Performed in the presence of a chaperone. ABDOMEN: Soft, no distention noted.  No tenderness, rebound or guarding.  BACK: No CVAT bilaterally. PELVIC: Normal appearing external genitalia and urethral meatus; normal appearing vaginal mucosa and cervix. Liletta IUD blue strings visualized about 2 cm in length from external os.  No abnormal discharge noted.  Pap smear obtained.  Normal uterine size, no other palpable masses, no uterine or adnexal tenderness.  Performed in the presence of a chaperone.  Results for orders placed or performed in visit on 10/11/19 (from the past 24 hour(s))  POCT Urinalysis Dipstick     Status: None   Collection Time: 10/11/19 10:25 AM  Result Value Ref Range   Color, UA yellow    Clarity, UA clear    Glucose, UA Negative Negative   Bilirubin, UA neg    Ketones, UA neg    Spec Grav, UA 1.015 1.010 - 1.025   Blood, UA trace    pH, UA 7.0 5.0 - 8.0   Protein, UA Negative Negative   Urobilinogen, UA 0.2 0.2 or 1.0 E.U./dL   Nitrite, UA neg    Leukocytes, UA Negative Negative   Appearance     Odor       Assessment and Plan:      1. Back pain 2. Abnormal finding on urinalysis Urine dipstick showed trace blood, no LE. Will check urine culture.  May also have small stone causing intermittent pain. Will continue to watch symptoms.  - POCT Urinalysis Dipstick - Urine Culture   3. IUD check up No concerns. Due for removal/replacement 04/01/2025 or earlier for any problems or if desired.  4. Well woman exam with routine gynecological exam - Cytology - PAP( [place]) -  Cervicovaginal ancillary only( [place]) - Hepatitis B surface antigen - Hepatitis C antibody - HIV Antibody (routine testing w rflx) - RPR Will follow up results of pap smear and labs manage accordingly. Told to use OTC GERD remedies for upper abdominal pain, contact PCP if worsens. Routine preventative health maintenance measures emphasized. Please refer to After Visit Summary for other counseling recommendations.      Verita Schneiders, MD, [place] for [employer], [place]

## 2019-10-11 NOTE — Progress Notes (Signed)
Pt is here for annual gyn exam. Last pap 09/06/2015, normal. Pt has Liletta IUD, inserted 04/01/2018. Pt does not get cycles with her IUD. Pt requests STD testing today. Pt also reports lower back pain and abdominal cramping for a couple weeks, denies dysuria.

## 2019-10-11 NOTE — Patient Instructions (Signed)
Preventive Care 21-26 Years Old, Female Preventive care refers to visits with your health care provider and lifestyle choices that can promote health and wellness. This includes:  A yearly physical exam. This may also be called an annual well check.  Regular dental visits and eye exams.  Immunizations.  Screening for certain conditions.  Healthy lifestyle choices, such as eating a healthy diet, getting regular exercise, not using drugs or products that contain nicotine and tobacco, and limiting alcohol use. What can I expect for my preventive care visit? Physical exam Your health care provider will check your:  Height and weight. This may be used to calculate body mass index (BMI), which tells if you are at a healthy weight.  Heart rate and blood pressure.  Skin for abnormal spots. Counseling Your health care provider may ask you questions about your:  Alcohol, tobacco, and drug use.  Emotional well-being.  Home and relationship well-being.  Sexual activity.  Eating habits.  Work and work environment.  Method of birth control.  Menstrual cycle.  Pregnancy history. What immunizations do I need?  Influenza (flu) vaccine  This is recommended every year. Tetanus, diphtheria, and pertussis (Tdap) vaccine  You may need a Td booster every 10 years. Varicella (chickenpox) vaccine  You may need this if you have not been vaccinated. Human papillomavirus (HPV) vaccine  If recommended by your health care provider, you may need three doses over 6 months. Measles, mumps, and rubella (MMR) vaccine  You may need at least one dose of MMR. You may also need a second dose. Meningococcal conjugate (MenACWY) vaccine  One dose is recommended if you are age 19-21 years and a first-year college student living in a residence hall, or if you have one of several medical conditions. You may also need additional booster doses. Pneumococcal conjugate (PCV13) vaccine  You may need  this if you have certain conditions and were not previously vaccinated. Pneumococcal polysaccharide (PPSV23) vaccine  You may need one or two doses if you smoke cigarettes or if you have certain conditions. Hepatitis A vaccine  You may need this if you have certain conditions or if you travel or work in places where you may be exposed to hepatitis A. Hepatitis B vaccine  You may need this if you have certain conditions or if you travel or work in places where you may be exposed to hepatitis B. Haemophilus influenzae type b (Hib) vaccine  You may need this if you have certain conditions. You may receive vaccines as individual doses or as more than one vaccine together in one shot (combination vaccines). Talk with your health care provider about the risks and benefits of combination vaccines. What tests do I need?  Blood tests  Lipid and cholesterol levels. These may be checked every 5 years starting at age 20.  Hepatitis C test.  Hepatitis B test. Screening  Diabetes screening. This is done by checking your blood sugar (glucose) after you have not eaten for a while (fasting).  Sexually transmitted disease (STD) testing.  BRCA-related cancer screening. This may be done if you have a family history of breast, ovarian, tubal, or peritoneal cancers.  Pelvic exam and Pap test. This may be done every 3 years starting at age 21. Starting at age 30, this may be done every 5 years if you have a Pap test in combination with an HPV test. Talk with your health care provider about your test results, treatment options, and if necessary, the need for more tests.   Follow these instructions at home: Eating and drinking   Eat a diet that includes fresh fruits and vegetables, whole grains, lean protein, and low-fat dairy.  Take vitamin and mineral supplements as recommended by your health care provider.  Do not drink alcohol if: ? Your health care provider tells you not to drink. ? You are  pregnant, may be pregnant, or are planning to become pregnant.  If you drink alcohol: ? Limit how much you have to 0-1 drink a day. ? Be aware of how much alcohol is in your drink. In the [place] one drink equals one 12 oz bottle of beer (355 mL), one 5 oz glass of wine (148 mL), or one 1 oz glass of hard liquor (44 mL). Lifestyle  Take daily care of your teeth and gums.  Stay active. Exercise for at least 30 minutes on 5 or more days each week.  Do not use any products that contain nicotine or tobacco, such as cigarettes, e-cigarettes, and chewing tobacco. If you need help quitting, ask your health care provider.  If you are sexually active, practice safe sex. Use a condom or other form of birth control (contraception) in order to prevent pregnancy and STIs (sexually transmitted infections). If you plan to become pregnant, see your health care provider for a preconception visit. What's next?  Visit your health care provider once a year for a well check visit.  Ask your health care provider how often you should have your eyes and teeth checked.  Stay up to date on all vaccines. This information is not intended to replace advice given to you by your health care provider. Make sure you discuss any questions you have with your health care provider. Document Revised: 03/19/2018 Document Reviewed: 03/19/2018 Elsevier Patient Education  2020 [employer].

## 2019-10-12 LAB — CERVICOVAGINAL ANCILLARY ONLY
Bacterial Vaginitis (gardnerella): NEGATIVE
Candida Glabrata: NEGATIVE
Candida Vaginitis: NEGATIVE
Chlamydia: NEGATIVE
Comment: NEGATIVE
Comment: NEGATIVE
Comment: NEGATIVE
Comment: NEGATIVE
Comment: NEGATIVE
Comment: NORMAL
Neisseria Gonorrhea: NEGATIVE
Trichomonas: NEGATIVE

## 2019-10-12 LAB — HEPATITIS B SURFACE ANTIGEN: Hepatitis B Surface Ag: NEGATIVE

## 2019-10-12 LAB — RPR: RPR Ser Ql: NONREACTIVE

## 2019-10-12 LAB — HEPATITIS C ANTIBODY: Hep C Virus Ab: <0.1 s/co ratio (ref 0.0–0.9)

## 2019-10-12 LAB — HIV ANTIBODY (ROUTINE TESTING W REFLEX): HIV Screen 4th Generation wRfx: NONREACTIVE

## 2019-10-13 ENCOUNTER — Encounter: Payer: Self-pay | Admitting: Obstetrics & Gynecology

## 2019-10-13 LAB — CYTOLOGY - PAP: Diagnosis: NEGATIVE

## 2019-10-13 LAB — URINE CULTURE

## 2019-10-29 ENCOUNTER — Encounter (HOSPITAL_COMMUNITY): Payer: Self-pay

## 2019-10-29 ENCOUNTER — Ambulatory Visit (HOSPITAL_COMMUNITY)
Admission: EM | Admit: 2019-10-29 | Discharge: 2019-10-29 | Disposition: A | Discharge status: Home / Self Care | Payer: Medicare (Managed Care) | Attending: Family Medicine | Admitting: Family Medicine

## 2019-10-29 ENCOUNTER — Other Ambulatory Visit: Payer: Self-pay

## 2019-10-29 DIAGNOSIS — J029 Acute pharyngitis, unspecified: Secondary | ICD-10-CM | POA: Diagnosis present

## 2019-10-29 DIAGNOSIS — R102 Pelvic and perineal pain: Secondary | ICD-10-CM | POA: Diagnosis not present

## 2019-10-29 DIAGNOSIS — H66002 Acute suppurative otitis media without spontaneous rupture of ear drum, left ear: Secondary | ICD-10-CM | POA: Insufficient documentation

## 2019-10-29 DIAGNOSIS — Z20822 Contact with and (suspected) exposure to covid-19: Secondary | ICD-10-CM | POA: Insufficient documentation

## 2019-10-29 DIAGNOSIS — Z793 Long term (current) use of hormonal contraceptives: Secondary | ICD-10-CM | POA: Diagnosis not present

## 2019-10-29 LAB — POCT RAPID STREP A: Streptococcus, Group A Screen (Direct): NEGATIVE

## 2019-10-29 MED ORDER — AMOXICILLIN-POT CLAVULANATE 875-125 MG PO TABS: 1.0000 | ORAL_TABLET | Freq: Two times a day (BID) | ORAL | 0 refills | Status: AC

## 2019-10-29 MED ORDER — IBUPROFEN 600 MG PO TABS: 600.0000 mg | ORAL_TABLET | Freq: Four times a day (QID) | ORAL | 1 refills | Status: DC | PRN

## 2019-10-29 MED ORDER — FLUTICASONE PROPIONATE 50 MCG/ACT NA SUSP: 1.0000 | Freq: Every day | NASAL | 2 refills | Status: DC

## 2019-10-29 NOTE — Discharge Instructions (Signed)
Take your first dose of Augmentin this evening and then it is twice a day for 10 total days  Take the ibuprofen as prescribed up to every 6 hours  Begin using the Flonase daily as this can help your ear drain  The medicines we have started you on should help, however if after 3 to 5 days you are continue to have no improvement please return.  If your Covid-19 test is positive, you will receive a phone call from [facility] regarding your results. Negative test results are not called. Both positive and negative results area always visible on MyChart. If you do not have a MyChart account, sign up instructions are in your discharge papers.   Persons who are directed to care for themselves at home may discontinue isolation under the following conditions:   At least 10 days have passed since symptom onset and  At least 24 hours have passed without running a fever (this means without the use of fever-reducing medications) and  Other symptoms have improved.  Persons infected with COVID-19 who never develop symptoms may discontinue isolation and other precautions 10 days after the date of their first positive COVID-19 test.

## 2019-10-29 NOTE — ED Triage Notes (Signed)
Pt c/o ear pain, sore throat, with "white areas to tonsils", HA, fever, congestion for approx 4 days. Today fever 99.3.  Also reports n/d and lower abdom pain.  Was recently tx for hematuria.   Received first COVID vaccine last Monday. Took 24 hour Claritin, sudafed, and excedrin at 1500  for HA today.

## 2019-10-29 NOTE — ED Provider Notes (Signed)
[facility]    CSN: [id number] Arrival date & time: 10/29/19  1520      History   Chief Complaint Chief Complaint  Patient presents with  . Fever  . Otalgia    HPI Michele Dixon is a 24 y.o. female.   Patient only utilizes American sign language and American sign language translator was used for the duration of this visit  Patient presents for 4 days of worsening sore throat.  She reports pain is such that it is difficult to swallow at times.  Pain is reported as a 10/10.  Pain is worse on the left side.  She also reports having white spots on her tonsils recently.  She reports she has been able to drink liquids.  She also endorses fever, chills and body aches.  She has had a low level headache.  She has also had left-sided ear pain that has been on and off over the last 2 months but has become significantly worse over the last couple days.  Denies ear drainage.  Does not hurt to move the ear.  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  She reports a mild cough.  She has had sinus congestion on and off recently as well.  Denies significant cough.  Denies shortness of breath, nausea, vomiting, diarrhea.  Denies abdominal pain to this provider.  Patient did have some other reported symptoms and nurses note however she is adamant her primary concern is her ear and throat.     Past Medical History:  Diagnosis Date  . Cochlear implant in place    age 37  . Exudative pharyngitis 01/01/2013  . FUO (fever of unknown origin) 01/01/2013  . Infection    UTI  . Migraines   . Ovarian cyst    ~6th grade  . Sensorineural hearing loss (SNHL) of both ears    can read lips but unsure about medical terms  . Syncope and collapse 01/01/2013    Patient Active Problem List   Diagnosis Date Noted  . Liletta IUD (intrauterine device) in place since 04/01/2018 04/01/2018  . Uses sign language interpreter 08/05/2017  . Bilateral sensorineural hearing  loss 06/07/2013  . 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 Migraine headache 07/05/2008  . 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 COCHLEAR IMPLANT Right age 50  . FOREIGN BODY REMOVAL Left 10/29/2016   Procedure: EXCISION FOREIGN BODY LEFT ARM;  Surgeon: Arta Bruce Kinsinger, MD;  Location: [facility];  Service: General;  Laterality: Left;  . NORPLANT REMOVAL Left 05/15/2016   Procedure: Attempted REMOVAL OF Nexplanon;  Surgeon: Emily Filbert, MD;  Location: [facility];  Service: Gynecology;  Laterality: Left;  Left upper arm.    OB History    Gravida  1   Para  1   Term  1   Preterm  0   AB  0   Living  1     SAB  0   TAB  0   Ectopic  0   Multiple  0   Live Births  1            Home Medications    Prior to Admission medications   Medication Sig Start Date End Date Taking? Authorizing Provider  amoxicillin-clavulanate (AUGMENTIN) 875-125 MG tablet Take 1 tablet by mouth 2 (two) times daily for 10 days. 10/29/19 11/08/19  Areen Trautner, Veryl Speak, PA-C  fluticasone (FLONASE) 50 MCG/ACT nasal spray Place 1 spray into both nostrils daily. 10/29/19   Awesome Jared, Veryl Speak, PA-C  ibuprofen (ADVIL) 600 MG tablet Take 1 tablet (600 mg total) by mouth every 6 (six) hours as needed. 10/29/19   Sully Manzi, Veryl Speak, PA-C  Levonorgestrel (LILETTA, 52 MG,) 19.5 MCG/DAY IUD IUD 1 each by Intrauterine route once.    [provider]  Prenatal Vit-Fe Fumarate-FA (PRENATAL VITAMIN PO) Take 1 tablet by mouth daily.     [provider]    Family History Family History  Problem Relation Age of Onset  . Hypertension Mother   . Hyperlipidemia Father   . Prostate cancer Father   . Pancreatitis Sister   . Pancreatitis Maternal Aunt   . Breast cancer Maternal Aunt   . Pancreatitis Maternal Grandmother     Social History Social History   Tobacco Use  . Smoking status: Never Smoker  . Smokeless tobacco: Never Used   Substance Use Topics  . Alcohol use: Not Currently    Comment: occasional  . Drug use: No     Allergies   Patient has no known allergies.   Review of Systems Review of Systems See HPI  Physical Exam Triage Vital Signs ED Triage Vitals  Enc Vitals Group     BP 10/29/19 1632 120/82     Pulse Rate 10/29/19 1632 92     Resp 10/29/19 1632 18     Temp 10/29/19 1632 99.2 F (37.3 C)     Temp Source 10/29/19 1632 Oral     SpO2 10/29/19 1632 100 %     Weight --      Height --      Head Circumference --      Peak Flow --      Pain Score 10/29/19 1628 10     Pain Loc --      Pain Edu? --      Excl. in GC? --    No data found.  Updated Vital Signs BP 120/82 (BP Location: Right Arm)   Pulse 92   Temp 99.2 F (37.3 C) (Oral)   Resp 18   SpO2 100%   Visual Acuity Right Eye Distance:   Left Eye Distance:   Bilateral Distance:    Right Eye Near:   Left Eye Near:    Bilateral Near:     Physical Exam Vitals and nursing note reviewed.  Constitutional:      General: She is not in acute distress.    Appearance: She is well-developed.  HENT:     Head: Normocephalic and atraumatic.     Right Ear: Tympanic membrane normal.     Ears:     Comments: Left ear : erythematous tympanic membrane with evidence of suppurative fluid behind the eardrum.  Canal normal    Mouth/Throat:     Mouth: Mucous membranes are moist.     Comments: Mild erythema with 2+ tonsils.  No obvious exudates appreciable at this time. Eyes:     Conjunctiva/sclera: Conjunctivae normal.  Cardiovascular:     Rate and Rhythm: Normal rate and regular rhythm.     Heart sounds: No murmur.  Pulmonary:     Effort: Pulmonary effort is normal. No respiratory distress.     Breath sounds: Normal breath sounds.  Abdominal:     Palpations: Abdomen is soft.     Tenderness: There is no abdominal tenderness.  Musculoskeletal:     Cervical back:  Neck supple.  Lymphadenopathy:     Cervical: Cervical adenopathy  present.  Skin:    General: Skin is warm and dry.  Neurological:     Mental Status: She is alert.      UC Treatments / Results  Labs (all labs ordered are listed, but only abnormal results are displayed) Labs Reviewed  SARS CORONAVIRUS 2 (TAT 6-24 HRS)  CULTURE, GROUP A STREP [facility])  POCT RAPID STREP A    EKG   Radiology No results found.  Procedures Procedures (including critical care time)  Medications Ordered in UC Medications - No data to display  Initial Impression / Assessment and Plan / UC Course  I have reviewed the triage vital signs and the nursing notes.  Pertinent labs & imaging results that were available during my care of the patient were reviewed by me and considered in my medical decision making (see chart for details).     #Otitis media of left ear #Pharyngitis Patient is a 24 year old-year-old presenting with pharyngitis and otitis media.  Rapid strep was negative.  Throat culture sent.  Covid PCR was also sent.  Given patient has evidence of otitis media with chills and subjective fever will treat with Augmentin x10 days.  Discussed taking ibuprofen and Tylenol for pain relief.  Discussed starting Flonase in order to aid in likely eustachian tube dysfunction.  Discussed return and follow-up precautions with patient.  Patient reports understanding plan.   Final Clinical Impressions(s) / UC Diagnoses   Final diagnoses:  Acute suppurative otitis media of left ear without spontaneous rupture of tympanic membrane, recurrence not specified  Acute pharyngitis, unspecified etiology     Discharge Instructions     Take your first dose of Augmentin this evening and then it is twice a day for 10 total days  Take the ibuprofen as prescribed up to every 6 hours  Begin using the Flonase daily as this can help your ear drain  The medicines we have started you on should help, however if after 3 to 5 days you are continue to have no improvement please  return.  If your Covid-19 test is positive, you will receive a phone call from [facility] regarding your results. Negative test results are not called. Both positive and negative results area always visible on MyChart. If you do not have a MyChart account, sign up instructions are in your discharge papers.   Persons who are directed to care for themselves at home may discontinue isolation under the following conditions:  . At least 10 days have passed since symptom onset and . At least 24 hours have passed without running a fever (this means without the use of fever-reducing medications) and . Other symptoms have improved.  Persons infected with COVID-19 who never develop symptoms may discontinue isolation and other precautions 10 days after the date of their first positive COVID-19 test.     ED Prescriptions    Medication Sig Dispense Auth. Provider   amoxicillin-clavulanate (AUGMENTIN) 875-125 MG tablet Take 1 tablet by mouth 2 (two) times daily for 10 days. 20 tablet Yesha Muchow, Veryl Speak, PA-C   ibuprofen (ADVIL) 600 MG tablet Take 1 tablet (600 mg total) by mouth every 6 (six) hours as needed. 30 tablet Raygen Linquist, Veryl Speak, PA-C   fluticasone (FLONASE) 50 MCG/ACT nasal spray Place 1 spray into both nostrils daily. 11.1 mL Tyrina Hines, Veryl Speak, PA-C     PDMP not reviewed this encounter.   Hermelinda Medicus, PA-C 10/30/19 0800

## 2019-10-30 LAB — SARS CORONAVIRUS 2 (TAT 6-24 HRS): SARS Coronavirus 2: NEGATIVE

## 2019-11-01 LAB — CULTURE, GROUP A STREP (THRC)

## 2020-08-22 ENCOUNTER — Ambulatory Visit (INDEPENDENT_AMBULATORY_CARE_PROVIDER_SITE_OTHER): Payer: Medicare (Managed Care)

## 2020-08-22 ENCOUNTER — Encounter (HOSPITAL_COMMUNITY): Payer: Self-pay

## 2020-08-22 ENCOUNTER — Ambulatory Visit (HOSPITAL_COMMUNITY)
Admission: EM | Admit: 2020-08-22 | Discharge: 2020-08-22 | Disposition: A | Discharge status: Home / Self Care | Payer: Medicare (Managed Care) | Attending: Student | Admitting: Student

## 2020-08-22 ENCOUNTER — Other Ambulatory Visit: Payer: Self-pay

## 2020-08-22 DIAGNOSIS — S99921A Unspecified injury of right foot, initial encounter: Secondary | ICD-10-CM | POA: Diagnosis not present

## 2020-08-22 DIAGNOSIS — S91331A Puncture wound without foreign body, right foot, initial encounter: Secondary | ICD-10-CM | POA: Diagnosis not present

## 2020-08-22 DIAGNOSIS — W2209XA Striking against other stationary object, initial encounter: Secondary | ICD-10-CM | POA: Diagnosis not present

## 2020-08-22 DIAGNOSIS — Z9229 Personal history of other drug therapy: Secondary | ICD-10-CM

## 2020-08-22 MED ORDER — TRIPLE ANTIBIOTIC 5-400-5000 EX OINT: TOPICAL_OINTMENT | Freq: Two times a day (BID) | CUTANEOUS | 0 refills | Status: DC

## 2020-08-22 MED ORDER — AMOXICILLIN-POT CLAVULANATE 875-125 MG PO TABS: 1.0000 | ORAL_TABLET | Freq: Two times a day (BID) | ORAL | 0 refills | Status: DC

## 2020-08-22 NOTE — ED Provider Notes (Signed)
[facility]    CSN: [id number] Arrival date & time: 08/22/20  1619      History   Chief Complaint Chief Complaint  Patient presents with  . Foot Injury    HPI Michele Dixon is a 25 y.o. female presenting following stepping on a rusty nail in her garage 8 hours ago. History of sensorineural hearing loss, uses ASL. I communicated with her today using language line. She states nail went through her shoe into the ball of right foot. Painful around the site and feels like something is still in there. She can move her toes and ankle though moving toes hurts. Denies sensation changes. Denies other injuries. Tdap UTD 2019. Denies fevers/chills.  HPI  Past Medical History:  Diagnosis Date  . Cochlear implant in place    age 12  . Exudative pharyngitis 01/01/2013  . FUO (fever of unknown origin) 01/01/2013  . Infection    UTI  . Migraines   . Ovarian cyst    ~6th grade  . Sensorineural hearing loss (SNHL) of both ears    can read lips but unsure about medical terms  . Syncope and collapse 01/01/2013    Patient Active Problem List   Diagnosis Date Noted  . Liletta IUD (intrauterine device) in place since 04/01/2018 04/01/2018  . Uses sign language interpreter 08/05/2017  . Bilateral sensorineural hearing loss 06/07/2013  . 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 Migraine headache 07/05/2008  . 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 COCHLEAR IMPLANT Right age 63  . FOREIGN BODY REMOVAL Left 10/29/2016   Procedure: EXCISION FOREIGN BODY LEFT ARM;  Surgeon: De Blanch Kinsinger, MD;  Location: [facility];  Service: General;  Laterality: Left;  . NORPLANT REMOVAL Left 05/15/2016   Procedure: Attempted REMOVAL OF Nexplanon;  Surgeon: Allie Bossier, MD;  Location: [facility];  Service: Gynecology;  Laterality: Left;  Left upper arm.    OB History    Gravida  1   Para  1   Term  1   Preterm  0    AB  0   Living  1     SAB  0   IAB  0   Ectopic  0   Multiple  0   Live Births  1            Home Medications    Prior to Admission medications   Medication Sig Start Date End Date Taking? Authorizing Provider  amoxicillin-clavulanate (AUGMENTIN) 875-125 MG tablet Take 1 tablet by mouth every 12 (twelve) hours. 08/22/20  Yes Rhys Martini, PA-C  neomycin-bacitracin-polymyxin (NEOSPORIN) [phone] ointment Apply topically 2 (two) times daily. 08/22/20  Yes Rhys Martini, PA-C  fluticasone (FLONASE) 50 MCG/ACT nasal spray Place 1 spray into both nostrils daily. 10/29/19   Darr, Gerilyn Pilgrim, PA-C  ibuprofen (ADVIL) 600 MG tablet Take 1 tablet (600 mg total) by mouth every 6 (six) hours as needed. 10/29/19   Darr, Gerilyn Pilgrim, PA-C  Levonorgestrel (LILETTA, 52 MG,) 19.5 MCG/DAY IUD IUD 1 each by Intrauterine route once.    [provider]  Prenatal Vit-Fe Fumarate-FA (PRENATAL VITAMIN PO) Take 1 tablet by mouth daily.     [provider]    Family History Family History  Problem Relation Age of Onset  . Hypertension Mother   . Hyperlipidemia Father   . Prostate cancer Father   . Pancreatitis Sister   . Pancreatitis  Maternal Aunt   . Breast cancer Maternal Aunt   . Pancreatitis Maternal Grandmother     Social History Social History   Tobacco Use  . Smoking status: Never Smoker  . Smokeless tobacco: Never Used  Substance Use Topics  . Alcohol use: Not Currently    Comment: occasional  . Drug use: No     Allergies   Patient has no known allergies.   Review of Systems Review of Systems  Skin: Positive for wound.  All other systems reviewed and are negative.    Physical Exam Triage Vital Signs ED Triage Vitals  Enc Vitals Group     BP      Pulse      Resp      Temp      Temp src      SpO2      Weight      Height      Head Circumference      Peak Flow      Pain Score      Pain Loc      Pain Edu?      Excl. in GC?    No data  found.  Updated Vital Signs BP 120/87 (BP Location: Left Arm)   Pulse 80   Temp 99.2 F (37.3 C) (Oral)   Resp 17   SpO2 100%   Visual Acuity Right Eye Distance:   Left Eye Distance:   Bilateral Distance:    Right Eye Near:   Left Eye Near:    Bilateral Near:     Physical Exam Vitals reviewed.  Constitutional:      Appearance: Normal appearance.  HENT:     Head: Normocephalic and atraumatic.  Cardiovascular:     Rate and Rhythm: Normal rate and regular rhythm.     Heart sounds: Normal heart sounds.  Pulmonary:     Effort: Pulmonary effort is normal.     Breath sounds: Normal breath sounds.  Musculoskeletal:     Comments: Ball right foot with 60mm puncture wound to medial ventral aspect. Not actively bleeding, no purulent exudate. Tenderness to palpation around wound site. ROM intact all 5 toes, discomfort with movement great toe. No wound on dorsal aspect.   Neurological:     General: No focal deficit present.     Mental Status: She is alert and oriented to person, place, and time.  Psychiatric:        Mood and Affect: Mood normal.        Behavior: Behavior normal.        Thought Content: Thought content normal.        Judgment: Judgment normal.      UC Treatments / Results  Labs (all labs ordered are listed, but only abnormal results are displayed) Labs Reviewed - No data to display  EKG   Radiology DG Foot Complete Right  Result Date: 08/22/2020 CLINICAL DATA:  Stepped on a rusty nail. EXAM: RIGHT FOOT COMPLETE - 3+ VIEW COMPARISON:  None. FINDINGS: There is no evidence of fracture or dislocation. There is no evidence of arthropathy or other focal bone abnormality. Soft tissues are unremarkable. No radiopaque foreign body identified. IMPRESSION: Negative. Electronically Signed   By: Obie Dredge M.D.   On: 08/22/2020 17:40    Procedures Procedures (including critical care time)  Medications Ordered in UC Medications - No data to display  Initial  Impression / Assessment and Plan / UC Course  I have reviewed the triage vital  signs and the nursing notes.  Pertinent labs & imaging results that were available during my care of the patient were reviewed by me and considered in my medical decision making (see chart for details).     Tdap UTD 2019.   Xray R foot:  FINDINGS: There is no evidence of fracture or dislocation. There is no evidence of arthropathy or other focal bone abnormality. Soft tissues are unremarkable. No radiopaque foreign body identified. IMPRESSION: Negative.  Augmentin sent for antibiotic prophylaxis.  Wound care instructions provided.  Return precautions- chest pain, shortness of breath, new/worsening fevers/chills, confusion, worsening of symptoms despite the above treatment plan, etc.    Using language line, spent over 40 minutes obtaining H&P, performing physical, discussing results, interpreting films, providing wound care and bandaging wound, discussing treatment plan and plan for follow-up with patient. Patient agrees with plan.    Final Clinical Impressions(s) / UC Diagnoses   Final diagnoses:  Puncture wound of right foot, initial encounter  History of tetanus, diphtheria, and acellular pertussis booster vaccination (Tdap)     Discharge Instructions     -Augmentin twice daily for 7 days. This will help prevent infection.  -Wound care:  1. Keep the wound clean and dry for the next 12 hours. Leave your bandage on for this time.  2. After 12 hours, clean the wound once a day. ? Wash the wound with soap and water. ? Rinse the wound with water to remove all soap. ? Pat the wound dry with a clean towel. Do not rub the wound. 3. After you clean the wound, put a thin layer of antibiotic ointment on it as told by your doctor. This ointment: ? Helps to prevent infection. ? Keeps the bandage from sticking to the wound. 4. Come back and see [place] if the area seems to be getting infected despite treatment;  if your foot is getting more painful or swollen; etc.       ED Prescriptions    Medication Sig Dispense Auth. Provider   amoxicillin-clavulanate (AUGMENTIN) 875-125 MG tablet Take 1 tablet by mouth every 12 (twelve) hours. 14 tablet Rhys Martini, PA-C   neomycin-bacitracin-polymyxin (NEOSPORIN) [phone] ointment Apply topically 2 (two) times daily. 50 g Rhys Martini, PA-C     PDMP not reviewed this encounter.   Rhys Martini, PA-C 08/22/20 [phone]

## 2020-08-22 NOTE — ED Triage Notes (Signed)
Pt reports stepping on a rusty nail in the garage this morning. She states when she walks it hurts and it is hot to the touch. Pt states she had her tetanus shot 9-10 years ago. She states she feels the nail hit the bone.

## 2020-08-22 NOTE — Discharge Instructions (Signed)
-  Augmentin twice daily for 7 days. This will help prevent infection.  -Wound care:  Keep the wound clean and dry for the next 12 hours. Leave your bandage on for this time.  After 12 hours, clean the wound once a day. Wash the wound with soap and water. Rinse the wound with water to remove all soap. Pat the wound dry with a clean towel. Do not rub the wound. After you clean the wound, put a thin layer of antibiotic ointment on it as told by your doctor. This ointment: Helps to prevent infection. Keeps the bandage from sticking to the wound. Come back and see [place] if the area seems to be getting infected despite treatment; if your foot is getting more painful or swollen; etc.

## 2020-11-09 ENCOUNTER — Other Ambulatory Visit: Payer: Self-pay

## 2020-11-09 ENCOUNTER — Emergency Department (HOSPITAL_BASED_OUTPATIENT_CLINIC_OR_DEPARTMENT_OTHER)
Admission: EM | Admit: 2020-11-09 | Discharge: 2020-11-09 | Disposition: A | Discharge status: Home / Self Care | Payer: Medicare (Managed Care) | Attending: Emergency Medicine | Admitting: Emergency Medicine

## 2020-11-09 ENCOUNTER — Encounter (HOSPITAL_BASED_OUTPATIENT_CLINIC_OR_DEPARTMENT_OTHER): Payer: Self-pay

## 2020-11-09 DIAGNOSIS — R21 Rash and other nonspecific skin eruption: Secondary | ICD-10-CM | POA: Diagnosis not present

## 2020-11-09 NOTE — ED Notes (Signed)
Interpreter left at Bedside for Staff/EDP Use. Patient made aware of Call Bell and Staff made aware.

## 2020-11-09 NOTE — ED Notes (Signed)
ED Provider at bedside. 

## 2020-11-09 NOTE — ED Triage Notes (Signed)
Sign Language Interpreter at Bedside for Triage/Care.  Patient here POV from Home with Hives and Joint Pain.   Hives began approx. 2 weeks ago and had been worsening since. Pain and swelling were alleviated somewhat with Zyrtec but then joints in hand began to ache and pain worsened again.   Ambulatory, Deaf, GCS 15.

## 2020-11-09 NOTE — ED Provider Notes (Signed)
[facility] EMERGENCY DEPT Provider Note  CSN: [id number] Arrival date & time: 11/09/20 0118  Chief Complaint(s) Urticaria and Joint Pain  HPI Michele Dixon is a 25 y.o. female here for several days of itchy rash noted to her feet and BUE. Appears to me improving. Associated with soreness to the areas. She has associated right MCP discomfort distal to an area of swelling and itchy rash.  She denies any known triggers.  No new medications.  No known insect bites.  No shortness of breath.  No recent fevers or infections.  No coughing or congestion.  HPI  Past Medical History Past Medical History:  Diagnosis Date  . Cochlear implant in place    age 70  . Exudative pharyngitis 01/01/2013  . FUO (fever of unknown origin) 01/01/2013  . Infection    UTI  . Migraines   . Ovarian cyst    ~6th grade  . Sensorineural hearing loss (SNHL) of both ears    can read lips but unsure about medical terms  . Syncope and collapse 01/01/2013   Patient Active Problem List   Diagnosis Date Noted  . Liletta IUD (intrauterine device) in place since 04/01/2018 04/01/2018  . Uses sign language interpreter 08/05/2017  . Bilateral sensorineural hearing loss 06/07/2013  . 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 Migraine headache 07/05/2008  . 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 Authorizing Provider  amoxicillin-clavulanate (AUGMENTIN) 875-125 MG tablet Take 1 tablet by mouth every 12 (twelve) hours. 08/22/20   Rhys Martini, PA-C  fluticasone (FLONASE) 50 MCG/ACT nasal spray Place 1 spray into both nostrils daily. 10/29/19   Darr, Gerilyn Pilgrim, PA-C  ibuprofen (ADVIL) 600 MG tablet Take 1 tablet (600 mg total) by mouth every 6 (six) hours as needed. 10/29/19   Darr, Gerilyn Pilgrim, PA-C  Levonorgestrel (LILETTA, 52 MG,) 19.5 MCG/DAY IUD IUD 1 each by Intrauterine route once.    [provider]   neomycin-bacitracin-polymyxin (NEOSPORIN) [phone] ointment Apply topically 2 (two) times daily. 08/22/20   Rhys Martini, PA-C  Prenatal Vit-Fe Fumarate-FA (PRENATAL VITAMIN PO) Take 1 tablet by mouth daily.     [provider]                                                                                                                                    Past Surgical History Past Surgical History:  Procedure Laterality Date  . COCHLEAR IMPLANT Right age 57  . FOREIGN BODY REMOVAL Left 10/29/2016   Procedure: EXCISION FOREIGN BODY LEFT ARM;  Surgeon: De Blanch Kinsinger, MD;  Location: [facility];  Service: General;  Laterality: Left;  . NORPLANT REMOVAL Left 05/15/2016   Procedure: Attempted REMOVAL OF Nexplanon;  Surgeon: Allie Bossier, MD;  Location: [facility];  Service: Gynecology;  Laterality: Left;  Left upper arm.  Family History Family History  Problem Relation Age of Onset  . Hypertension Mother   . Hyperlipidemia Father   . Prostate cancer Father   . Pancreatitis Sister   . Pancreatitis Maternal Aunt   . Breast cancer Maternal Aunt   . Pancreatitis Maternal Grandmother     Social History Social History   Tobacco Use  . Smoking status: Never Smoker  . Smokeless tobacco: Never Used  Substance Use Topics  . Alcohol use: Not Currently    Comment: occasional  . Drug use: No   Allergies Patient has no known allergies.  Review of Systems Review of Systems All other systems are reviewed and are negative for acute change except as noted in the HPI  Physical Exam Vital Signs  I have reviewed the triage vital signs BP (!) 126/98 (BP Location: Left Arm)   Pulse 89   Temp 98.9 F (37.2 C) (Oral)   Resp 16   Ht 5\' 4"  (1.626 m)   Wt 72 kg   SpO2 100%   BMI 27.25 kg/m   Physical Exam Vitals reviewed.  Constitutional:      General: She is not in acute distress.    Appearance: She is well-developed. She is not diaphoretic.  HENT:      Head: Normocephalic and atraumatic.     Nose: Nose normal.  Eyes:     General: No scleral icterus.       Right eye: No discharge.        Left eye: No discharge.     Conjunctiva/sclera: Conjunctivae normal.     Pupils: Pupils are equal, round, and reactive to light.  Cardiovascular:     Rate and Rhythm: Normal rate and regular rhythm.     Heart sounds: No murmur heard. No friction rub. No gallop.   Pulmonary:     Effort: Pulmonary effort is normal. No respiratory distress.     Breath sounds: Normal breath sounds. No stridor. No rales.  Abdominal:     General: There is no distension.     Palpations: Abdomen is soft.     Tenderness: There is no abdominal tenderness.  Musculoskeletal:        General: No tenderness.     Cervical back: Normal range of motion and neck supple.  Skin:    General: Skin is warm and dry.     Findings: Rash present. No erythema. Rash is macular (numerous single and grouped areas to left heel, and BUE.).  Neurological:     Mental Status: She is alert and oriented to person, place, and time.     ED Results and Treatments Labs (all labs ordered are listed, but only abnormal results are displayed) Labs Reviewed - No data to display                                                                                                                       EKG  EKG Interpretation  Date/Time:    Ventricular Rate:  PR Interval:    QRS Duration:   QT Interval:    QTC Calculation:   R Axis:     Text Interpretation:        Radiology No results found.  Pertinent labs & imaging results that were available during my care of the patient were reviewed by me and considered in my medical decision making (see chart for details).  Medications Ordered in ED Medications - No data to display                                                                                                                                  Procedures Procedures  (including critical care  time)  Medical Decision Making / ED Course I have reviewed the nursing notes for this encounter and the patient's prior records (if available in EHR or on provided paperwork).   Michele Dixon was evaluated in Emergency Department on 11/09/2020 for the symptoms described in the history of present illness. She was evaluated in the context of the global COVID-19 pandemic, which necessitated consideration that the patient might be at risk for infection with the SARS-CoV-2 virus that causes COVID-19. Institutional protocols and algorithms that pertain to the evaluation of patients at risk for COVID-19 are in a state of rapid change based on information released by regulatory bodies including the CDC and federal and state organizations. These policies and algorithms were followed during the patient's care in the ED.  Patient with pruritic rash. Possible urticaria versus insect bites. No evidence of anaphylaxis. She denies any infectious symptoms. Recommended supportive management with antihistamines.       Final Clinical Impression(s) / ED Diagnoses Final diagnoses:  Rash   The patient appears reasonably screened and/or stabilized for discharge and I doubt any other medical condition or other [facility] requiring further screening, evaluation, or treatment in the ED at this time prior to discharge. Safe for discharge with strict return precautions.  Disposition: Discharge  Condition: Good  I have discussed the results, Dx and Tx plan with the patient/family who expressed understanding and agree(s) with the plan. Discharge instructions discussed at length. The patient/family was given strict return precautions who verbalized understanding of the instructions. No further questions at time of discharge.    ED Discharge Orders    None       Follow Up: Primary care provider   if you do not have a primary care physician, contact [facility] at [phone] for referral      This chart  was dictated using voice recognition software.  Despite best efforts to proofread,  errors can occur which can change the documentation meaning.   Nira Conn, MD 11/09/20 [phone]

## 2021-07-09 ENCOUNTER — Encounter (HOSPITAL_BASED_OUTPATIENT_CLINIC_OR_DEPARTMENT_OTHER): Payer: Self-pay | Admitting: Emergency Medicine

## 2021-07-09 ENCOUNTER — Emergency Department (HOSPITAL_BASED_OUTPATIENT_CLINIC_OR_DEPARTMENT_OTHER)
Admission: EM | Admit: 2021-07-09 | Discharge: 2021-07-10 | Disposition: A | Discharge status: Home / Self Care | Payer: Medicare (Managed Care) | Attending: Emergency Medicine | Admitting: Emergency Medicine

## 2021-07-09 ENCOUNTER — Other Ambulatory Visit: Payer: Self-pay

## 2021-07-09 DIAGNOSIS — R059 Cough, unspecified: Secondary | ICD-10-CM | POA: Diagnosis present

## 2021-07-09 DIAGNOSIS — Z20822 Contact with and (suspected) exposure to covid-19: Secondary | ICD-10-CM | POA: Diagnosis not present

## 2021-07-09 DIAGNOSIS — R509 Fever, unspecified: Secondary | ICD-10-CM | POA: Insufficient documentation

## 2021-07-09 DIAGNOSIS — Z5321 Procedure and treatment not carried out due to patient leaving prior to being seen by health care provider: Secondary | ICD-10-CM | POA: Insufficient documentation

## 2021-07-09 DIAGNOSIS — J3489 Other specified disorders of nose and nasal sinuses: Secondary | ICD-10-CM | POA: Insufficient documentation

## 2021-07-09 DIAGNOSIS — R531 Weakness: Secondary | ICD-10-CM | POA: Insufficient documentation

## 2021-07-09 DIAGNOSIS — R300 Dysuria: Secondary | ICD-10-CM | POA: Diagnosis not present

## 2021-07-09 DIAGNOSIS — R3915 Urgency of urination: Secondary | ICD-10-CM | POA: Insufficient documentation

## 2021-07-09 LAB — URINALYSIS, ROUTINE W REFLEX MICROSCOPIC
Bilirubin Urine: NEGATIVE
Glucose, UA: NEGATIVE mg/dL
Hgb urine dipstick: NEGATIVE
Ketones, ur: NEGATIVE mg/dL
Leukocytes,Ua: NEGATIVE
Nitrite: NEGATIVE
Specific Gravity, Urine: 1.026 (ref 1.005–1.030)
pH: 7 (ref 5.0–8.0)

## 2021-07-09 LAB — RESP PANEL BY RT-PCR (FLU A&B, COVID) ARPGX2
Influenza A by PCR: POSITIVE — AB
Influenza B by PCR: NEGATIVE
SARS Coronavirus 2 by RT PCR: NEGATIVE

## 2021-07-09 LAB — PREGNANCY, URINE: Preg Test, Ur: NEGATIVE

## 2021-07-09 MED ORDER — ACETAMINOPHEN 325 MG PO TABS
650.0000 mg | ORAL_TABLET | Freq: Once | ORAL | Status: AC | PRN
  Administered 2021-07-09: 19:00:00 650 mg via ORAL
  Filled 2021-07-09: qty 2

## 2021-07-09 NOTE — ED Triage Notes (Signed)
Pt arrives to ED with c/o productive green mucous cough. Associated symptoms include chills, rhinorrhea, body aches, weakness, fevers. This started x2 days ago. Pt also reports dysuria that started this morning. Associated symptoms include urinary urgency.

## 2021-08-15 ENCOUNTER — Other Ambulatory Visit: Payer: Self-pay

## 2021-08-15 ENCOUNTER — Ambulatory Visit: Payer: Medicare (Managed Care)

## 2021-08-15 ENCOUNTER — Other Ambulatory Visit (HOSPITAL_COMMUNITY)
Admission: RE | Admit: 2021-08-15 | Discharge: 2021-08-15 | Disposition: A | Discharge status: Home / Self Care | Payer: Medicare (Managed Care) | Source: Ambulatory Visit | Attending: Obstetrics and Gynecology | Admitting: Obstetrics and Gynecology

## 2021-08-15 DIAGNOSIS — B379 Candidiasis, unspecified: Secondary | ICD-10-CM

## 2021-08-15 DIAGNOSIS — N898 Other specified noninflammatory disorders of vagina: Secondary | ICD-10-CM | POA: Diagnosis present

## 2021-08-15 MED ORDER — FLUCONAZOLE 150 MG PO TABS: 150.0000 mg | ORAL_TABLET | Freq: Once | ORAL | 0 refills | Status: DC

## 2021-08-15 NOTE — Progress Notes (Signed)
SUBJECTIVE:  26 y.o. female complains of a brownish discharge with occasional odor when sweating. She also noted thick, cottage cheese discharge like  for about 1 week(s). Patient also complains of having pain on the left side that started about a month ago. She is concerned that her IUD may be causing the pain. Patient also states that her partner can now feel her strings and has not previously been able to feel them until about a month ago. No UTI symptoms. Denies history of known exposure to STD.  No LMP recorded. (Menstrual status: IUD).  OBJECTIVE:  She appears well, afebrile. Urine dipstick: not done.  ASSESSMENT:  Vaginal Discharge  Vaginal Odor   PLAN:  GC, chlamydia, trichomonas, BVAG, CVAG probe sent to lab. Treatment: To be determined once lab results are received ROV prn if symptoms persist or worsen.   Diflucan sent to the pharmacy per protocol. Patient also scheduled appt for IUD check.

## 2021-08-16 LAB — CERVICOVAGINAL ANCILLARY ONLY
Bacterial Vaginitis (gardnerella): NEGATIVE
Candida Glabrata: NEGATIVE
Candida Vaginitis: NEGATIVE
Chlamydia: NEGATIVE
Comment: NEGATIVE
Comment: NEGATIVE
Comment: NEGATIVE
Comment: NEGATIVE
Comment: NEGATIVE
Comment: NORMAL
Neisseria Gonorrhea: NEGATIVE
Trichomonas: NEGATIVE

## 2021-08-20 ENCOUNTER — Encounter: Payer: Self-pay | Admitting: Advanced Practice Midwife

## 2021-08-20 ENCOUNTER — Ambulatory Visit (INDEPENDENT_AMBULATORY_CARE_PROVIDER_SITE_OTHER): Payer: Medicare (Managed Care) | Admitting: Advanced Practice Midwife

## 2021-08-20 ENCOUNTER — Other Ambulatory Visit: Payer: Self-pay

## 2021-08-20 VITALS — Wt 176.4 lb

## 2021-08-20 DIAGNOSIS — Z30431 Encounter for routine checking of intrauterine contraceptive device: Secondary | ICD-10-CM | POA: Diagnosis not present

## 2021-08-20 NOTE — Progress Notes (Signed)
° °  GYNECOLOGY CLINIC PROGRESS NOTE  History:  26 y.o. G1P1001 here at [facility] today for today for IUD string check; Liletta IUD was placed  04/2018. Partner can feel the IUD strings, she is concerned they are longer than they used to be.  She has light spotting most months, no other bleeding.    The following portions of the patient's history were reviewed and updated as appropriate: allergies, current medications, past family history, past medical history, past social history, past surgical history and problem list. Last pap smear on 10/11/19 was normal.  Review of Systems:  Pertinent items are noted in HPI.   Objective:  Physical Exam Weight 176 lb 6.4 oz (80 kg). Gen: NAD Abd: Soft, nontender and nondistended Pelvic: Normal appearing external genitalia; normal appearing vaginal mucosa and cervix.  IUD strings visualized, about 4 cm in length outside cervix. Cut to 2.5-3 cm at visit today.  Assessment & Plan:  Normal IUD check. Strings cut per pt preference, follow up at annual exam  Patient to keep IUD in place for five to seven years; can come in for removal if she desires pregnancy  Routine preventative health maintenance measures emphasized.  Sharen Counter, CNM 9:20 AM

## 2021-08-20 NOTE — Progress Notes (Signed)
Pt presents for IUD check c/o long strings. Partner can feel strings.  Liletta inserted 04/01/18

## 2021-09-18 ENCOUNTER — Other Ambulatory Visit: Payer: Self-pay

## 2021-09-18 ENCOUNTER — Encounter: Payer: Self-pay | Admitting: Obstetrics & Gynecology

## 2021-09-18 ENCOUNTER — Ambulatory Visit (INDEPENDENT_AMBULATORY_CARE_PROVIDER_SITE_OTHER): Payer: Medicare (Managed Care) | Admitting: Obstetrics & Gynecology

## 2021-09-18 ENCOUNTER — Other Ambulatory Visit (HOSPITAL_COMMUNITY)
Admission: RE | Admit: 2021-09-18 | Discharge: 2021-09-18 | Disposition: A | Discharge status: Home / Self Care | Payer: Medicare (Managed Care) | Source: Ambulatory Visit | Attending: Obstetrics & Gynecology | Admitting: Obstetrics & Gynecology

## 2021-09-18 VITALS — BP 126/83 | HR 78 | Ht 64.0 in | Wt 176.0 lb

## 2021-09-18 DIAGNOSIS — Z789 Other specified health status: Secondary | ICD-10-CM | POA: Diagnosis not present

## 2021-09-18 DIAGNOSIS — Z01419 Encounter for gynecological examination (general) (routine) without abnormal findings: Secondary | ICD-10-CM | POA: Diagnosis not present

## 2021-09-18 DIAGNOSIS — Z975 Presence of (intrauterine) contraceptive device: Secondary | ICD-10-CM | POA: Diagnosis not present

## 2021-09-18 DIAGNOSIS — H903 Sensorineural hearing loss, bilateral: Secondary | ICD-10-CM | POA: Diagnosis not present

## 2021-09-18 NOTE — Progress Notes (Signed)
Annual exam No complaints Depression and anxiety screen negative

## 2021-09-18 NOTE — Progress Notes (Signed)
Patient ID: Michele Dixon, female   DOB: 09-Aug-1995, 26 y.o.   MRN: [id number]  Cc: annual gyn exam   HPI Michele Dixon is a 26 y.o. female.  G1P1001 No LMP recorded. (Menstrual status: IUD). She is well satisfied with her IUD with no complaint, in place for 4 years.Last pap was 23 mo ago and was normal. She has no menstrual bleeding HPI  Past Medical History:  Diagnosis Date   Cochlear implant in place    age 22   Exudative pharyngitis 01/01/2013   FUO (fever of unknown origin) 01/01/2013   Infection    UTI   Migraines    Ovarian cyst    ~6th grade   Sensorineural hearing loss (SNHL) of both ears    can read lips but unsure about medical terms   Syncope and collapse 01/01/2013    Past Surgical History:  Procedure Laterality Date   COCHLEAR IMPLANT Right age 6   FOREIGN BODY REMOVAL Left 10/29/2016   Procedure: EXCISION FOREIGN BODY LEFT ARM;  Surgeon: Rodman Pickle, MD;  Location: [facility];  Service: General;  Laterality: Left;   NORPLANT REMOVAL Left 05/15/2016   Procedure: Attempted REMOVAL OF Nexplanon;  Surgeon: Allie Bossier, MD;  Location: [facility];  Service: Gynecology;  Laterality: Left;  Left upper arm.    Family History  Problem Relation Age of Onset   Hypertension Mother    Hyperlipidemia Father    Prostate cancer Father    Pancreatitis Sister    Pancreatitis Maternal Aunt    Breast cancer Maternal Aunt    Pancreatitis Maternal Grandmother     Social History Social History   Tobacco Use   Smoking status: Never   Smokeless tobacco: Never  Vaping Use   Vaping Use: Never used  Substance Use Topics   Alcohol use: Not Currently    Comment: occasional   Drug use: No    No Known Allergies  Current Outpatient Medications  Medication Sig Dispense Refill   levonorgestrel (LILETTA) 19.5 MCG/DAY IUD IUD 1 each by Intrauterine route once.     amoxicillin-clavulanate (AUGMENTIN) 875-125 MG tablet Take 1 tablet by mouth every 12 (twelve)  hours. 14 tablet 0   fluconazole (DIFLUCAN) 150 MG tablet Take 1 tablet (150 mg total) by mouth once for 1 dose. 1 tablet 0   fluticasone (FLONASE) 50 MCG/ACT nasal spray Place 1 spray into both nostrils daily. 11.1 mL 2   ibuprofen (ADVIL) 600 MG tablet Take 1 tablet (600 mg total) by mouth every 6 (six) hours as needed. 30 tablet 1   neomycin-bacitracin-polymyxin (NEOSPORIN) [phone] ointment Apply topically 2 (two) times daily. 50 g 0   Prenatal Vit-Fe Fumarate-FA (PRENATAL VITAMIN PO) Take 1 tablet by mouth daily.     No current facility-administered medications for this visit.    Review of Systems Review of Systems  Constitutional: Negative.   HENT: Negative.    Respiratory: Negative.    Gastrointestinal: Negative.        Occasional heartburn  Genitourinary: Negative.  Negative for dyspareunia, menstrual problem, pelvic pain and vaginal bleeding.  Musculoskeletal: Negative.   Skin: Negative.    Blood pressure 126/83, pulse 78, height 5\' 4"  (1.626 m), weight 176 lb (79.8 kg).  Physical Exam Physical Exam Vitals and nursing note reviewed. Exam conducted with a chaperone present.  Constitutional:      Appearance: Normal appearance.  HENT:     Head: Normocephalic.  Cardiovascular:     Rate and Rhythm:  Normal rate and regular rhythm.     Heart sounds: Normal heart sounds.  Pulmonary:     Effort: Pulmonary effort is normal.     Breath sounds: Normal breath sounds.  Chest:  Breasts:    Right: Normal.     Left: Normal.  Abdominal:     General: Abdomen is flat.     Palpations: Abdomen is soft.  Genitourinary:    General: Normal vulva.     Exam position: Lithotomy position.     Vagina: Normal.     Cervix: Normal.     Uterus: Normal.      Adnexa: Right adnexa normal and left adnexa normal.        Comments: String 2 cm Musculoskeletal:     Cervical back: Normal range of motion.  Skin:    General: Skin is warm and dry.  Neurological:     General: No focal deficit  present.     Mental Status: She is alert and oriented to person, place, and time.  Psychiatric:        Mood and Affect: Mood normal.        Behavior: Behavior normal.    Data Reviewed Pap 09/2019 negative  Assessment Liletta IUD (intrauterine device) in place since 04/01/2018  Uses sign language interpreter  Bilateral sensorineural hearing loss  Well woman exam with routine gynecological exam   Plan Doing well with IUD Pap due 09/2022 Annual examinations BSE discussed    Michele Dixon 09/18/2021, 10:59 AM

## 2021-09-19 LAB — CERVICOVAGINAL ANCILLARY ONLY
Chlamydia: NEGATIVE
Comment: NEGATIVE
Comment: NEGATIVE
Comment: NORMAL
Neisseria Gonorrhea: NEGATIVE
Trichomonas: NEGATIVE

## 2021-09-19 LAB — RPR: RPR Ser Ql: NONREACTIVE

## 2021-09-19 LAB — HIV ANTIBODY (ROUTINE TESTING W REFLEX): HIV Screen 4th Generation wRfx: NONREACTIVE

## 2021-09-19 LAB — HEPATITIS B SURFACE ANTIGEN: Hepatitis B Surface Ag: NEGATIVE

## 2021-09-19 LAB — HEPATITIS C ANTIBODY: Hep C Virus Ab: NONREACTIVE

## 2021-11-23 ENCOUNTER — Ambulatory Visit (HOSPITAL_COMMUNITY)
Admission: EM | Admit: 2021-11-23 | Discharge: 2021-11-23 | Disposition: A | Discharge status: Home / Self Care | Payer: Medicare (Managed Care) | Attending: Nurse Practitioner | Admitting: Nurse Practitioner

## 2021-11-23 ENCOUNTER — Encounter (HOSPITAL_COMMUNITY): Payer: Self-pay

## 2021-11-23 DIAGNOSIS — J0140 Acute pansinusitis, unspecified: Secondary | ICD-10-CM

## 2021-11-23 MED ORDER — AMOXICILLIN-POT CLAVULANATE 875-125 MG PO TABS
1.0000 | ORAL_TABLET | Freq: Two times a day (BID) | ORAL | 0 refills | Status: AC
Start: 2021-11-23 — End: 2021-11-30

## 2021-11-23 NOTE — ED Triage Notes (Signed)
Pt reports left eye pain and migraine headache x 4 days. She reports having a history of headache, but never this bad. She is using Excedrin migraine daily.   ?

## 2021-11-23 NOTE — Discharge Instructions (Addendum)
-   Please start the Augmentin (antibiotic) for the sinus infection ?- Make sure you are drinking plenty of fluids and you can continue Mucinex.  You can also try saline nasal rinses to help with congestion ?- Please follow up with PCP or here in urgent care if your symptoms persist despite this treatment ?

## 2021-11-23 NOTE — ED Provider Notes (Signed)
?[facility] ? ? ? ?CSN: [id number] ?Arrival date & time: 11/23/21  1609 ? ? ?  ? ?History   ?Chief Complaint ?No chief complaint on file. ? ? ?HPI ?Michele Dixon is a 26 y.o. female.  ? ?Patient presents with American sign language medical interpreter.  Patient reports about 10 days of worsening headache over her left eye.  She reports nasal congestion and drainage started about 5 days ago.  She reports at times, the congestion is very thick, other times it is runny.  She also endorses body aches.  She reports headache that is severe at times only over her left eye.  The headache is worse when she coughs or when she blows her nose.  She also reports her left ear has been feeling stopped up.  She denies shortness of breath, chest pain, chest tightness.  She is eating and drinking normally.  Denies nausea/vomiting, diarrhea.  She has been taking Tylenol, Mucinex, and Excedrin which is helped minimally. ? ?Patient rates the current headache at a 4 out of 10.  She denies any photophobia. ? ? ?Past Medical History:  ?Diagnosis Date  ? Cochlear implant in place   ? age 22  ? Exudative pharyngitis 01/01/2013  ? FUO (fever of unknown origin) 01/01/2013  ? Infection   ? UTI  ? Migraines   ? Ovarian cyst   ? ~6th grade  ? Sensorineural hearing loss (SNHL) of both ears   ? can read lips but unsure about medical terms  ? Syncope and collapse 01/01/2013  ? ? ?Patient Active Problem List  ? Diagnosis Date Noted  ? Liletta IUD (intrauterine device) in place since 04/01/2018 04/01/2018  ? Uses sign language interpreter 08/05/2017  ? Bilateral sensorineural hearing loss 06/07/2013  ? 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 Migraine headache 07/05/2008  ? 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 ? ?Past Surgical History:  ?Procedure Laterality Date  ? COCHLEAR IMPLANT Right age 59  ? FOREIGN BODY REMOVAL Left 10/29/2016  ? Procedure: EXCISION FOREIGN BODY LEFT ARM;  Surgeon: Rodman Pickle, MD;  Location: [facility];  Service: General;  Laterality: Left;  ? NORPLANT REMOVAL Left 05/15/2016  ? Procedure: Attempted REMOVAL OF Nexplanon;  Surgeon: Allie Bossier, MD;  Location: [facility];  Service: Gynecology;  Laterality: Left;  Left upper arm.  ? ? ?OB History   ? ? Gravida  ?1  ? Para  ?1  ? Term  ?1  ? Preterm  ?0  ? AB  ?0  ? Living  ?1  ?  ? ? SAB  ?0  ? IAB  ?0  ? Ectopic  ?0  ? Multiple  ?0  ? Live Births  ?1  ?   ?  ?  ? ? ? ?Home Medications   ? ?Prior to Admission medications   ?Medication Sig Start Date End Date Taking? Authorizing Provider  ?amoxicillin-clavulanate (AUGMENTIN) 875-125 MG tablet Take 1 tablet by mouth every 12 (twelve) hours for 7 days. 11/23/21 11/30/21  Valentino Nose, NP  ?fluconazole (DIFLUCAN) 150 MG tablet Take 1 tablet (150 mg total) by mouth once for 1 dose. 08/15/21 08/15/21  Warden Fillers, MD  ?fluticasone (FLONASE) 50 MCG/ACT nasal spray Place 1 spray into both nostrils daily. 10/29/19   Darr, Gerilyn Pilgrim, PA-C  ?ibuprofen (ADVIL) 600 MG tablet Take 1 tablet (600 mg total) by mouth every 6 (six) hours as needed. 10/29/19   Darr, Gerilyn Pilgrim, PA-C  ?  levonorgestrel (LILETTA) 19.5 MCG/DAY IUD IUD 1 each by Intrauterine route once.    [provider]  ?neomycin-bacitracin-polymyxin (NEOSPORIN) [phone] ointment Apply topically 2 (two) times daily. 08/22/20   Rhys Martini, PA-C  ?Prenatal Vit-Fe Fumarate-FA (PRENATAL VITAMIN PO) Take 1 tablet by mouth daily.    [provider]  ? ? ?Family History ?Family History  ?Problem Relation Age of Onset  ? Hypertension Mother   ? Hyperlipidemia Father   ? Prostate cancer Father   ? Pancreatitis Sister   ? Pancreatitis Maternal Aunt   ? Breast cancer Maternal Aunt   ? Pancreatitis Maternal Grandmother   ? ? ?Social History ?Social History  ? ?Tobacco Use  ? Smoking status: Never  ? Smokeless tobacco: Never  ?Vaping Use  ? Vaping Use: Never used  ?Substance Use Topics  ? Alcohol use: Not Currently  ?  Comment: occasional  ? Drug use: No   ? ? ? ?Allergies   ?Patient has no known allergies. ? ? ?Review of Systems ?Review of Systems ?Per HPI ? ?Physical Exam ?Triage Vital Signs ?ED Triage Vitals [11/23/21 1627]  ?Enc Vitals Group  ?   BP (!) 130/93  ?   Pulse Rate 90  ?   Resp 18  ?   Temp 98.2 ?F (36.8 ?C)  ?   Temp Source Oral  ?   SpO2 99 %  ?   Weight   ?   Height   ?   Head Circumference   ?   Peak Flow   ?   Pain Score 4  ?   Pain Loc   ?   Pain Edu?   ?   Excl. in [place]?   ? ?No data found. ? ?Updated Vital Signs ?BP (!) 130/93 (BP Location: Left Arm)   Pulse 90   Temp 98.2 ?F (36.8 ?C) (Oral)   Resp 18   SpO2 99%  ? ?Visual Acuity ?Right Eye Distance:   ?Left Eye Distance:   ?Bilateral Distance:   ? ?Right Eye Near:   ?Left Eye Near:    ?Bilateral Near:    ? ?Physical Exam ?Vitals and nursing note reviewed.  ?Constitutional:   ?   General: She is not in acute distress. ?   Appearance: Normal appearance. She is not ill-appearing or toxic-appearing.  ?HENT:  ?   Head: Normocephalic and atraumatic.  ?   Right Ear: Ear canal and external ear normal. Tympanic membrane is injected, erythematous and bulging.  ?   Left Ear: Tympanic membrane, ear canal and external ear normal.  ?   Nose: Congestion and rhinorrhea present.  ?   Right Sinus: No maxillary sinus tenderness or frontal sinus tenderness.  ?   Left Sinus: Maxillary sinus tenderness and frontal sinus tenderness present.  ?   Mouth/Throat:  ?   Mouth: Mucous membranes are moist.  ?   Pharynx: Oropharynx is clear. Posterior oropharyngeal erythema present. No oropharyngeal exudate.  ?Eyes:  ?   General: No scleral icterus. ?   Extraocular Movements: Extraocular movements intact.  ?Cardiovascular:  ?   Rate and Rhythm: Normal rate and regular rhythm.  ?Pulmonary:  ?   Effort: Pulmonary effort is normal. No respiratory distress.  ?   Breath sounds: Normal breath sounds. No wheezing, rhonchi or rales.  ?Musculoskeletal:  ?   Cervical back: Normal range of motion and neck supple.  ?Lymphadenopathy:   ?   Cervical: No cervical adenopathy.  ?Skin: ?   General:  Skin is warm and dry.  ?   Coloration: Skin is not jaundiced or pale.  ?   Findings: No erythema or rash.  ?Neurological:  ?   Mental Status: She is alert and oriented to person, place, and time.  ?Psychiatric:     ?   Mood and Affect: Mood normal.     ?   Behavior: Behavior normal.  ? ? ? ?UC Treatments / Results  ?Labs ?(all labs ordered are listed, but only abnormal results are displayed) ?Labs Reviewed - No data to display ? ?EKG ? ? ?Radiology ?No results found. ? ?Procedures ?Procedures (including critical care time) ? ?Medications Ordered in UC ?Medications - No data to display ? ?Initial Impression / Assessment and Plan / UC Course  ?I have reviewed the triage vital signs and the nursing notes. ? ?Pertinent labs & imaging results that were available during my care of the patient were reviewed by me and considered in my medical decision making (see chart for details). ? ?  ?Treat sinus infection with Augmentin twice daily for 7 days.  Encouraged drinking plenty of fluids, Mucinex, saline nasal rinses.  Follow-up in urgent care with primary care provider if symptoms do not improve despite this treatment.  The patient was given the opportunity to ask questions.  All questions answered to their satisfaction.  The patient is in agreement to this plan.  ? ?Final Clinical Impressions(s) / UC Diagnoses  ? ?Final diagnoses:  ?Acute non-recurrent pansinusitis  ? ? ? ?Discharge Instructions   ? ?  ?- Please start the Augmentin (antibiotic) for the sinus infection ?- Make sure you are drinking plenty of fluids and you can continue Mucinex.  You can also try saline nasal rinses to help with congestion ?- Please follow up with PCP or here in urgent care if your symptoms persist despite this treatment ? ? ? ? ?ED Prescriptions   ? ? Medication Sig Dispense Auth. Provider  ? amoxicillin-clavulanate (AUGMENTIN) 875-125 MG tablet Take 1 tablet by mouth every 12  (twelve) hours for 7 days. 14 tablet Valentino NoseMartinez, Chanell A, NP  ? ?  ? ?PDMP not reviewed this encounter. ?  ?Valentino NoseMartinez, Denis A, NP ?11/23/21 1745 ? ?

## 2022-02-04 ENCOUNTER — Ambulatory Visit (INDEPENDENT_AMBULATORY_CARE_PROVIDER_SITE_OTHER): Payer: Medicare (Managed Care)

## 2022-02-04 ENCOUNTER — Other Ambulatory Visit (HOSPITAL_COMMUNITY)
Admission: RE | Admit: 2022-02-04 | Discharge: 2022-02-04 | Disposition: A | Discharge status: Home / Self Care | Payer: Medicare (Managed Care) | Source: Ambulatory Visit | Attending: Obstetrics & Gynecology | Admitting: Obstetrics & Gynecology

## 2022-02-04 DIAGNOSIS — N898 Other specified noninflammatory disorders of vagina: Secondary | ICD-10-CM

## 2022-02-04 DIAGNOSIS — B3731 Acute candidiasis of vulva and vagina: Secondary | ICD-10-CM

## 2022-02-04 NOTE — Progress Notes (Signed)
..  SUBJECTIVE:  26 y.o. female complains of creamy vaginal discharge and irritation for 1 day(s). Denies abnormal vaginal bleeding or significant pelvic pain or fever. No UTI symptoms. Denies history of known exposure to STD.  No LMP recorded. (Menstrual status: IUD).  OBJECTIVE:  She appears well, afebrile. Urine dipstick: not done.  ASSESSMENT:  Vaginal Discharge  Vaginal irritation   PLAN:  GC, chlamydia, trichomonas, BVAG, CVAG probe sent to lab. Treatment: To be determined once lab results are received ROV prn if symptoms persist or worsen.

## 2022-02-05 LAB — CERVICOVAGINAL ANCILLARY ONLY
Bacterial Vaginitis (gardnerella): NEGATIVE
Candida Glabrata: NEGATIVE
Candida Vaginitis: POSITIVE — AB
Chlamydia: NEGATIVE
Comment: NEGATIVE
Comment: NEGATIVE
Comment: NEGATIVE
Comment: NEGATIVE
Comment: NEGATIVE
Comment: NORMAL
Neisseria Gonorrhea: NEGATIVE
Trichomonas: NEGATIVE

## 2022-02-06 ENCOUNTER — Telehealth: Payer: Self-pay | Admitting: *Deleted

## 2022-02-06 MED ORDER — FLUCONAZOLE 150 MG PO TABS: 150.0000 mg | ORAL_TABLET | Freq: Once | ORAL | 3 refills | Status: DC

## 2022-02-06 NOTE — Telephone Encounter (Signed)
TC to pt to f/u on call from 02/05/22 regarding test results. Call answered by sign language interpreter service. Call forwarded to pt. No answer. VM message left using sign language service. RX previously sent and message sent from provider and read by pt.

## 2022-02-06 NOTE — Addendum Note (Signed)
Addended by: Jaynie Collins A on: 02/06/2022 09:24 AM   Modules accepted: Orders

## 2022-08-08 ENCOUNTER — Other Ambulatory Visit: Payer: Self-pay | Admitting: Obstetrics & Gynecology

## 2022-08-08 DIAGNOSIS — B3731 Acute candidiasis of vulva and vagina: Secondary | ICD-10-CM

## 2022-10-04 ENCOUNTER — Encounter (HOSPITAL_COMMUNITY): Payer: Self-pay

## 2022-10-04 ENCOUNTER — Ambulatory Visit (HOSPITAL_COMMUNITY)
Admission: EM | Admit: 2022-10-04 | Discharge: 2022-10-04 | Disposition: A | Discharge status: Home / Self Care | Payer: Medicare (Managed Care) | Attending: Internal Medicine | Admitting: Internal Medicine

## 2022-10-04 DIAGNOSIS — J069 Acute upper respiratory infection, unspecified: Secondary | ICD-10-CM

## 2022-10-04 DIAGNOSIS — Z1152 Encounter for screening for COVID-19: Secondary | ICD-10-CM | POA: Insufficient documentation

## 2022-10-04 DIAGNOSIS — R6889 Other general symptoms and signs: Secondary | ICD-10-CM | POA: Insufficient documentation

## 2022-10-04 LAB — POC INFLUENZA A AND B ANTIGEN (URGENT CARE ONLY)
INFLUENZA A ANTIGEN, POC: NEGATIVE
INFLUENZA B ANTIGEN, POC: NEGATIVE

## 2022-10-04 LAB — SARS CORONAVIRUS 2 (TAT 6-24 HRS): SARS Coronavirus 2: NEGATIVE

## 2022-10-04 MED ORDER — ONDANSETRON 4 MG PO TBDP: 4.0000 mg | ORAL_TABLET | Freq: Three times a day (TID) | ORAL | 0 refills | Status: DC | PRN

## 2022-10-04 MED ORDER — ACETAMINOPHEN 325 MG PO TABS
ORAL_TABLET | ORAL | Status: AC
  Filled 2022-10-04: qty 2

## 2022-10-04 MED ORDER — ACETAMINOPHEN 325 MG PO TABS
650.0000 mg | ORAL_TABLET | Freq: Once | ORAL | Status: AC
  Administered 2022-10-04: 650 mg via ORAL

## 2022-10-04 NOTE — ED Provider Notes (Signed)
[place]    CSN: [id number] Arrival date & time: 10/04/22  0849      History   Chief Complaint Chief Complaint  Patient presents with   Nausea   Sore Throat    HPI Michele Dixon is a 27 y.o. female.   Patient presents today with 4 day history of abdominal cramping and nausea; began with fever/chills/body aches last night.  Also having sore throat, nasal congestion, runny nose, post nasal drainage, headache, ear pain, and fatigue.  Patient denies cough, congestion, congested cough, dry cough, shortness of breath, chest pain with coughing, wheezing, chest tightness, chest congestion, sinus pressure, vomiting, diarrhea, decreased appetite, loss of taste, loss of smell, and rash. Has not taken anything for symptoms so far.  Patient reports she has IUD and is confident she is not pregnant; does not have menses with IUD.  Son was sick last week with viral conjunctivitis, also had ear infection.  He is 29 years old.  ASL interpreter used for today's visit.    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    OB History     Gravida  1   Para  1   Term  1   Preterm  0   AB  0   Living  1      SAB  0   IAB  0   Ectopic  0   Multiple  0   Live Births  1            Home Medications    Prior to Admission medications   Medication Sig Start Date End Date Taking? Authorizing Provider  ondansetron (ZOFRAN-ODT) 4 MG disintegrating tablet Take 1 tablet (4 mg total) by mouth every 8 (eight) hours as needed for nausea or vomiting. 10/04/22  Yes Eulogio Bear, NP    Family History Family History  Problem Relation Age of Onset   Hypertension Mother    Hyperlipidemia Father    Prostate cancer Father    Pancreatitis Sister    Pancreatitis Maternal Aunt    Breast cancer Maternal Aunt    Pancreatitis Maternal Grandmother     Social History Social History   Tobacco Use   Smoking status: Never   Smokeless tobacco: Never  Vaping Use  Vaping Use: Never used  Substance Use Topics   Alcohol use: Not Currently    Comment: occasional   Drug use: No     Allergies   Patient has no known allergies.   Review of Systems Review of Systems Per HPI  Physical Exam Triage Vital Signs ED Triage Vitals [10/04/22 0929]  Enc Vitals Group     BP      Pulse Rate (!) 106     Resp 18     Temp (!) 100.9 F (38.3 C)     Temp Source Oral     SpO2 96 %     Weight      Height      Head Circumference      Peak Flow      Pain Score 5     Pain Loc      Pain Edu?      Excl. in [place]?    No data found.  Updated Vital Signs Pulse (!) 106   Temp (!) 100.9 F (38.3 C) (Oral)   Resp 18   SpO2 96%   After Tylenol: HR 95 and Temperature 99.5  Visual Acuity Right Eye Distance:   Left Eye Distance:   Bilateral Distance:    Right  Eye Near:   Left Eye Near:    Bilateral Near:     Physical Exam Vitals and nursing note reviewed.  Constitutional:      General: She is not in acute distress.    Appearance: Normal appearance. She is not ill-appearing or toxic-appearing.  HENT:     Head: Normocephalic and atraumatic.     Right Ear: Ear canal and external ear normal. No drainage, swelling or tenderness. A middle ear effusion is present. Tympanic membrane is not erythematous.     Left Ear: Ear canal and external ear normal. No drainage, swelling or tenderness. A middle ear effusion is present. Tympanic membrane is not erythematous.     Nose: Congestion present. No rhinorrhea.     Mouth/Throat:     Mouth: Mucous membranes are moist.     Pharynx: Oropharynx is clear. No oropharyngeal exudate or posterior oropharyngeal erythema.     Tonsils: No tonsillar exudate. 1+ on the right. 1+ on the left.  Eyes:     General: No scleral icterus.    Extraocular Movements: Extraocular movements intact.     Right eye: Normal extraocular motion.     Left eye: Normal extraocular motion.     Pupils: Pupils are equal, round, and reactive to light.  Cardiovascular:     Rate and Rhythm: Normal rate and regular rhythm.  Pulmonary:     Effort: Pulmonary effort is normal. No respiratory distress.     Breath sounds: Normal breath sounds. No wheezing, rhonchi or rales.  Abdominal:     General: Abdomen is flat. Bowel sounds are increased. There is no distension.     Palpations: Abdomen is soft.     Tenderness: There is abdominal tenderness in the left lower quadrant. There is no right CVA tenderness, left CVA tenderness or guarding. Negative signs include Murphy's sign, Rovsing's sign and McBurney's sign.  Musculoskeletal:     Cervical back: Normal range of motion and neck supple.  Lymphadenopathy:     Cervical: No cervical adenopathy.  Skin:    General: Skin is warm and dry.     Coloration: Skin is not jaundiced or pale.     Findings: No  erythema or rash.  Neurological:  Mental Status: She is alert and oriented to person, place, and time.     Motor: No weakness.  Psychiatric:        Behavior: Behavior is cooperative.      UC Treatments / Results  Labs (all labs ordered are listed, but only abnormal results are displayed) Labs Reviewed  SARS CORONAVIRUS 2 (TAT 6-24 HRS)  POC INFLUENZA A AND B ANTIGEN (URGENT CARE ONLY)    EKG   Radiology No results found.  Procedures Procedures (including critical care time)  Medications Ordered in UC Medications  acetaminophen (TYLENOL) tablet 650 mg (650 mg Oral Given 10/04/22 0935)    Initial Impression / Assessment and Plan / UC Course  I have reviewed the triage vital signs and the nursing notes.  Pertinent labs & imaging results that were available during my care of the patient were reviewed by me and considered in my medical decision making (see chart for details).   Initially in triage, patient is well-appearing, normotensive, not tachypneic, and oxygenating well on room air.  She has a low-grade fever of 100.9 F and heart rate is 106 initially.  After Tylenol administration, heart rate decreased to 95 and temperature 99.5 F.  Patient reports objective improvement in bodyaches and chills.    1. Viral URI 2. Encounter for screening for COVID-19 Vital signs and examination are reassuring today Suspect viral etiology Influenza test negative COVID-19 test pending Supportive care discussed ER and return precautions discussed with patient Start Zofran as needed for nausea/vomiting  The patient was given the opportunity to ask questions.  All questions answered to their satisfaction.  The patient is in agreement to this plan.    Final Clinical Impressions(s) / UC Diagnoses   Final diagnoses:  Viral URI  Encounter for screening for COVID-19  Flu-like symptoms     Discharge Instructions      Influenza test today is negative.  COVID-19 test is pending.   You should see the result in Mychart later this week.   You have a viral upper respiratory infection.  Symptoms should improve over the next week to 10 days.  If you develop chest pain or shortness of breath, go to the emergency room.  Some things that can make you feel better are: - Increased rest - Increasing fluid with water/sugar free electrolytes - Acetaminophen and ibuprofen as needed for fever/pain - Salt water gargling, chloraseptic spray and throat lozenges; and warm liquids with lemon/honey for sore throat - Saline sinus flushes or a neti pot - Humidifying the air  You can also take the Zofran every 8 hours as needed for nausea/vomiting     ED Prescriptions     Medication Sig Dispense Auth. Provider   ondansetron (ZOFRAN-ODT) 4 MG disintegrating tablet Take 1 tablet (4 mg total) by mouth every 8 (eight) hours as needed for nausea or vomiting. 20 tablet Eulogio Bear, NP      PDMP not reviewed this encounter.   Eulogio Bear, NP 10/04/22 1057

## 2022-10-04 NOTE — Discharge Instructions (Addendum)
Influenza test today is negative.  COVID-19 test is pending.  You should see the result in Mychart later this week.   You have a viral upper respiratory infection.  Symptoms should improve over the next week to 10 days.  If you develop chest pain or shortness of breath, go to the emergency room.  Some things that can make you feel better are: - Increased rest - Increasing fluid with water/sugar free electrolytes - Acetaminophen and ibuprofen as needed for fever/pain - Salt water gargling, chloraseptic spray and throat lozenges; and warm liquids with lemon/honey for sore throat - Saline sinus flushes or a neti pot - Humidifying the air  You can also take the Zofran every 8 hours as needed for nausea/vomiting

## 2022-10-04 NOTE — ED Triage Notes (Signed)
Patient with c/o nausea since Monday. Patient states last night she started having sore throat, body aches and headache. Denies vomiting and diarrhea.

## 2022-10-08 ENCOUNTER — Emergency Department (HOSPITAL_COMMUNITY)
Admission: EM | Admit: 2022-10-08 | Discharge: 2022-10-08 | Disposition: A | Discharge status: Home / Self Care | Payer: Medicare (Managed Care) | Attending: Emergency Medicine | Admitting: Emergency Medicine

## 2022-10-08 ENCOUNTER — Emergency Department (HOSPITAL_COMMUNITY): Payer: Medicare (Managed Care)

## 2022-10-08 ENCOUNTER — Encounter (HOSPITAL_COMMUNITY): Payer: Self-pay

## 2022-10-08 ENCOUNTER — Other Ambulatory Visit: Payer: Self-pay

## 2022-10-08 DIAGNOSIS — U071 COVID-19: Secondary | ICD-10-CM | POA: Diagnosis not present

## 2022-10-08 DIAGNOSIS — E860 Dehydration: Secondary | ICD-10-CM | POA: Diagnosis not present

## 2022-10-08 DIAGNOSIS — J029 Acute pharyngitis, unspecified: Secondary | ICD-10-CM | POA: Diagnosis present

## 2022-10-08 LAB — CBC WITH DIFFERENTIAL/PLATELET
Abs Immature Granulocytes: 0.03 10*3/uL (ref 0.00–0.07)
Basophils Absolute: 0 10*3/uL (ref 0.0–0.1)
Basophils Relative: 0 %
Eosinophils Absolute: 0.2 10*3/uL (ref 0.0–0.5)
Eosinophils Relative: 2 %
HCT: 39.2 % (ref 36.0–46.0)
Hemoglobin: 12.6 g/dL (ref 12.0–15.0)
Immature Granulocytes: 0 %
Lymphocytes Relative: 21 %
Lymphs Abs: 2.1 10*3/uL (ref 0.7–4.0)
MCH: 30.4 pg (ref 26.0–34.0)
MCHC: 32.1 g/dL (ref 30.0–36.0)
MCV: 94.7 fL (ref 80.0–100.0)
Monocytes Absolute: 0.7 10*3/uL (ref 0.1–1.0)
Monocytes Relative: 7 %
Neutro Abs: 6.9 10*3/uL (ref 1.7–7.7)
Neutrophils Relative %: 70 %
Platelets: 214 10*3/uL (ref 150–400)
RBC: 4.14 MIL/uL (ref 3.87–5.11)
RDW: 12.7 % (ref 11.5–15.5)
WBC: 9.9 10*3/uL (ref 4.0–10.5)
nRBC: 0 % (ref 0.0–0.2)

## 2022-10-08 LAB — I-STAT BETA HCG BLOOD, ED (MC, WL, AP ONLY): I-stat hCG, quantitative: <5 m[IU]/mL (ref <5)

## 2022-10-08 LAB — COMPREHENSIVE METABOLIC PANEL
ALT: 28 U/L (ref 0–44)
AST: 21 U/L (ref 15–41)
Albumin: 3.8 g/dL (ref 3.5–5.0)
Alkaline Phosphatase: 46 U/L (ref 38–126)
Anion gap: 8 (ref 5–15)
BUN: 9 mg/dL (ref 6–20)
CO2: 25 mmol/L (ref 22–32)
Calcium: 8.4 mg/dL — ABNORMAL LOW (ref 8.9–10.3)
Chloride: 102 mmol/L (ref 98–111)
Creatinine, Ser: 0.69 mg/dL (ref 0.44–1.00)
GFR, Estimated: >60 mL/min (ref >60)
Glucose, Bld: 142 mg/dL — ABNORMAL HIGH (ref 70–99)
Potassium: 3.6 mmol/L (ref 3.5–5.1)
Sodium: 135 mmol/L (ref 135–145)
Total Bilirubin: 0.6 mg/dL (ref 0.3–1.2)
Total Protein: 7.1 g/dL (ref 6.5–8.1)

## 2022-10-08 LAB — GROUP A STREP BY PCR: Group A Strep by PCR: NOT DETECTED

## 2022-10-08 LAB — TROPONIN I (HIGH SENSITIVITY): Troponin I (High Sensitivity): <2 ng/L (ref <18)

## 2022-10-08 LAB — RESP PANEL BY RT-PCR (RSV, FLU A&B, COVID)  RVPGX2
Influenza A by PCR: NEGATIVE
Influenza B by PCR: NEGATIVE
Resp Syncytial Virus by PCR: NEGATIVE
SARS Coronavirus 2 by RT PCR: POSITIVE — AB

## 2022-10-08 LAB — MONONUCLEOSIS SCREEN: Mono Screen: NEGATIVE

## 2022-10-08 MED ORDER — ACETAMINOPHEN 500 MG PO TABS
1000.0000 mg | ORAL_TABLET | Freq: Once | ORAL | Status: AC
  Administered 2022-10-08: 1000 mg via ORAL
  Filled 2022-10-08: qty 2

## 2022-10-08 MED ORDER — SODIUM CHLORIDE 0.9 % IV BOLUS
1000.0000 mL | Freq: Once | INTRAVENOUS | Status: AC
  Administered 2022-10-08: 1000 mL via INTRAVENOUS

## 2022-10-08 NOTE — Discharge Instructions (Signed)
Return for any problem.  ?

## 2022-10-08 NOTE — ED Provider Notes (Signed)
[place] EMERGENCY DEPARTMENT AT [facility] Provider Note   CSN: [id number] Arrival date & time: 10/08/22  1819     History  Chief Complaint  Patient presents with   Sore Throat   Chest Pain    Michele Dixon is a 27 y.o. female.  27 year old female with prior medical history as detailed below presents for evaluation.  Patient reports symptoms concerning for URI over the last 4 to 5 days.  Patient reports sore throat, mild cough, subjective fevers at home.  Patient was seen for same complaints at urgent care on March 15.  Patient reports that she was tested for flu and COVID which was negative.  She denies abdominal pain or vomiting.  She denies difficulty urinating.  ASL interpreter utilized for history.  The history is provided by the patient and medical records. A language interpreter was used.       Home Medications Prior to Admission medications   Medication Sig Start Date End Date Taking? Authorizing Provider  ondansetron (ZOFRAN-ODT) 4 MG disintegrating tablet Take 1 tablet (4 mg total) by mouth every 8 (eight) hours as needed for nausea or vomiting. 10/04/22   Eulogio Bear, NP      Allergies    Patient has no known allergies.    Review of Systems   Review of Systems  All other systems reviewed and are negative.   Physical Exam Updated Vital Signs BP 111/83   Pulse 88   Temp 98.4 F (36.9 C) (Oral)   Resp 20   Ht 5\' 4"  (1.626 m)   Wt 77.1 kg   SpO2 100%   BMI 29.18 kg/m  Physical Exam Vitals and nursing note reviewed.  Constitutional:      General: She is not in acute distress.    Appearance: Normal appearance. She is well-developed.  HENT:     Head: Normocephalic and atraumatic.     Mouth/Throat:     Pharynx: Posterior oropharyngeal erythema present.     Comments: Mild erythema of the posterior pharynx.  No exudates or tonsillar edema.  No evidence of PTA. Eyes:     Conjunctiva/sclera: Conjunctivae normal.     Pupils:  Pupils are equal, round, and reactive to light.  Cardiovascular:     Rate and Rhythm: Normal rate and regular rhythm.     Heart sounds: Normal heart sounds.  Pulmonary:     Effort: Pulmonary effort is normal. No respiratory distress.     Breath sounds: Normal breath sounds.  Abdominal:     General: There is no distension.     Palpations: Abdomen is soft.     Tenderness: There is no abdominal tenderness.  Musculoskeletal:        General: No deformity. Normal range of motion.     Cervical back: Normal range of motion and neck supple.  Skin:    General: Skin is warm and dry.  Neurological:     General: No focal deficit present.     Mental Status: She is alert and oriented to person, place, and time.     ED Results / Procedures / Treatments   Labs (all labs ordered are listed, but only abnormal results are displayed) Labs Reviewed  RESP PANEL BY RT-PCR (RSV, FLU A&B, COVID)  RVPGX2 - Abnormal; Notable for the following components:      Result Value   SARS Coronavirus 2 by RT PCR POSITIVE (*)    All other components within normal limits  COMPREHENSIVE METABOLIC PANEL -  Abnormal; Notable for the following components:   Glucose, Bld 142 (*)    Calcium 8.4 (*)    All other components within normal limits  GROUP A STREP BY PCR  CBC WITH DIFFERENTIAL/PLATELET  MONONUCLEOSIS SCREEN  I-STAT BETA HCG BLOOD, ED ([facility], [facility], [facility] ONLY)  TROPONIN I (HIGH SENSITIVITY)    EKG EKG Interpretation  Date/Time:  Tuesday October 08 2022 18:29:54 EDT Ventricular Rate:  108 PR Interval:  147 QRS Duration: 85 QT Interval:  322 QTC Calculation: 432 R Axis:   64 Text Interpretation: Sinus tachycardia Probable left atrial enlargement Confirmed by Dene Gentry [phone]) on 10/08/2022 7:21:50 PM  Radiology DG Chest 2 View  Result Date: 10/08/2022 CLINICAL DATA:  Reason for exam: chest pain- Pt had throat pain that started Saturday, pain is now going into right side of chest. The chest pain started  today, does not radiate and worsens with coughing and movement. EXAM: CHEST - 2 VIEW COMPARISON:  None Available. FINDINGS: Lungs are clear. Heart size and mediastinal contours are within normal limits. No effusion. Visualized bones unremarkable. IMPRESSION: No acute cardiopulmonary disease. Electronically Signed   By: Lucrezia Europe M.D.   On: 10/08/2022 18:43    Procedures Procedures    Medications Ordered in ED Medications  acetaminophen (TYLENOL) tablet 1,000 mg (1,000 mg Oral Given 10/08/22 1958)  sodium chloride 0.9 % bolus 1,000 mL (1,000 mLs Intravenous New Bag/Given 10/08/22 1955)    ED Course/ Medical Decision Making/ A&P                             Medical Decision Making Amount and/or Complexity of Data Reviewed Labs: ordered.  Risk OTC drugs.    Medical Screen Complete  This patient presented to the ED with complaint of cough, fever, myalgia.  This complaint involves an extensive number of treatment options. The initial differential diagnosis includes, but is not limited to, bacterial versus viral infection, metabolic abnormality, etc.  This presentation is: Acute, Self-Limited, Previously Undiagnosed, Uncertain Prognosis, Complicated, and Systemic Symptoms  Patient is presenting with constellation of symptoms most consistent with likely viral upper respiratory infection.  Patient's testing reflects this.    Patient is COVID-positive.  She has acute COVID infection.  She is flu and RSV negative.  Her Monospot is negative.  Her group a strep test is negative.  Other obtained labs are without significant abnormality.    Patient has been symptomatic for approximately 4 to 5 days.  Patient reassured by results of ED testing.  She understands appropriate management of her symptoms in the outpatient setting.  Importance of close follow-up is stressed.  Strict return precautions given and understood.  ASL interpreter utilized for entirety of communication during  evaluation.  Additional history obtained:  External records from outside sources obtained and reviewed including prior ED visits and prior Inpatient records.    Lab Tests:  I ordered and personally interpreted labs.  The pertinent results include: CBC, CMP, troponin, group A strep, COVID, flu, Monospot, ECG   Imaging Studies ordered:  I ordered imaging studies including chest x-ray I independently visualized and interpreted obtained imaging which showed NAD I agree with the radiologist interpretation.   Cardiac Monitoring:  The patient was maintained on a cardiac monitor.  I personally viewed and interpreted the cardiac monitor which showed an underlying rhythm of: Sinus tach, NSR   Medicines ordered:  I ordered medication including Tylenol, IV fluids for myalgia, dehydration Reevaluation  of the patient after these medicines showed that the patient: improved    Problem List / ED Course:  Acute COVID infection   Reevaluation:  After the interventions noted above, I reevaluated the patient and found that they have: improved  Disposition:  After consideration of the diagnostic results and the patients response to treatment, I feel that the patent would benefit from close outpatient follow-up.          Final Clinical Impression(s) / ED Diagnoses Final diagnoses:  COVID    Rx / DC Orders ED Discharge Orders     None         Valarie Merino, MD 10/08/22 2239

## 2022-10-08 NOTE — ED Triage Notes (Signed)
Pt had throat pain that started Saturday, pain is now going into right side of chest. The chest pain started today, does not radiate and worsens with coughing and movement. Pt describes pain as feeling like something is moving down her throat and into her chest. Pt was seen at [facility] on 3/15 and was negative for flu/covid. Denies difficulty breathing. C/o cough with yellow mucus, sore throat with white pus in back of throat, body aches, and fever.

## 2023-05-13 ENCOUNTER — Ambulatory Visit
Admission: EM | Admit: 2023-05-13 | Discharge: 2023-05-13 | Disposition: A | Discharge status: Home / Self Care | Payer: Medicare (Managed Care) | Attending: Internal Medicine | Admitting: Internal Medicine

## 2023-05-13 DIAGNOSIS — J209 Acute bronchitis, unspecified: Secondary | ICD-10-CM | POA: Diagnosis not present

## 2023-05-13 MED ORDER — PROMETHAZINE-DM 6.25-15 MG/5ML PO SYRP: 5.0000 mL | ORAL_SOLUTION | Freq: Three times a day (TID) | ORAL | 0 refills | Status: DC | PRN

## 2023-05-13 MED ORDER — AZITHROMYCIN 250 MG PO TABS: ORAL_TABLET | ORAL | 0 refills | Status: DC

## 2023-05-13 MED ORDER — PREDNISONE 20 MG PO TABS: ORAL_TABLET | ORAL | 0 refills | Status: DC

## 2023-05-13 NOTE — ED Triage Notes (Signed)
ASL interpreter being used. Patient here today with c/o cough X 2 weeks. The cough has been waking her up in the morning and causing her belly to hurt a little. She has a wedding to go to this Friday. She has tried taking Mucinex, Robitussin, and some steroid pills that her mom gave her but nothing has been helping. She is requesting an antibiotic and steroid.

## 2023-05-13 NOTE — ED Provider Notes (Signed)
[facility] - URGENT CARE CENTER  Note:  This document was prepared using Conservation officer, historic buildings and may include unintentional dictation errors.  MRN: [id number] DOB: [birth year]  Subjective:   Michele Dixon is a 27 y.o. female presenting for 2-week history of persistent congestion, sinus drainage, coughing, bronchospasms, chest congestion and chest pain.  Has been using multiple over-the-counter medications without significant relief.  She did try a few steroid pills from her mother but did not provide too much relief.  No history of asthma.  No smoking of any kind including cigarettes, cigars, vaping, marijuana use.    No current facility-administered medications for this encounter. No current outpatient medications on file.   No Known Allergies  Past Medical History:  Diagnosis Date   Cochlear implant in place    age 76   Exudative pharyngitis 01/01/2013   FUO (fever of unknown origin) 01/01/2013   Infection    UTI   Migraines    Ovarian cyst    ~6th grade   Sensorineural hearing loss (SNHL) of both ears    can read lips but unsure about medical terms   Syncope and collapse 01/01/2013     Past Surgical History:  Procedure Laterality Date   COCHLEAR IMPLANT Right age 71   FOREIGN BODY REMOVAL Left 10/29/2016   Procedure: EXCISION FOREIGN BODY LEFT ARM;  Surgeon: Rodman Pickle, MD;  Location: [facility];  Service: General;  Laterality: Left;   NORPLANT REMOVAL Left 05/15/2016   Procedure: Attempted REMOVAL OF Nexplanon;  Surgeon: Allie Bossier, MD;  Location: [facility];  Service: Gynecology;  Laterality: Left;  Left upper arm.    Family History  Problem Relation Age of Onset   Hypertension Mother    Hyperlipidemia Father    Prostate cancer Father    Pancreatitis Sister    Pancreatitis Maternal Aunt    Breast cancer Maternal Aunt    Pancreatitis Maternal Grandmother     Social History   Tobacco Use   Smoking status: Never   Smokeless  tobacco: Never  Vaping Use   Vaping status: Never Used  Substance Use Topics   Alcohol use: Not Currently    Comment: occasional   Drug use: No    ROS   Objective:   Vitals: BP (!) 129/95 (BP Location: Right Arm)   Pulse 93   Temp 98.8 F (37.1 C) (Oral)   Resp 16   Ht 5\' 4"  (1.626 m)   Wt 170 lb (77.1 kg)   SpO2 98%   BMI 29.18 kg/m   Physical Exam Constitutional:      General: She is not in acute distress.    Appearance: Normal appearance. She is well-developed and normal weight. She is not ill-appearing, toxic-appearing or diaphoretic.  HENT:     Head: Normocephalic and atraumatic.     Right Ear: Tympanic membrane, ear canal and external ear normal. No drainage or tenderness. No middle ear effusion. There is no impacted cerumen. Tympanic membrane is not erythematous or bulging.     Left Ear: Tympanic membrane, ear canal and external ear normal. No drainage or tenderness.  No middle ear effusion. There is no impacted cerumen. Tympanic membrane is not erythematous or bulging.     Nose: Nose normal. No congestion or rhinorrhea.     Mouth/Throat:     Mouth: Mucous membranes are moist. No oral lesions.     Pharynx: No pharyngeal swelling, oropharyngeal exudate, posterior oropharyngeal erythema or uvula swelling.  Tonsils: No tonsillar exudate or tonsillar abscesses.  Eyes:     General: No scleral icterus.       Right eye: No discharge.        Left eye: No discharge.     Extraocular Movements: Extraocular movements intact.     Right eye: Normal extraocular motion.     Left eye: Normal extraocular motion.     Conjunctiva/sclera: Conjunctivae normal.  Cardiovascular:     Rate and Rhythm: Normal rate and regular rhythm.     Heart sounds: Normal heart sounds. No murmur heard.    No friction rub. No gallop.  Pulmonary:     Effort: Pulmonary effort is normal. No respiratory distress.     Breath sounds: No stridor. No wheezing, rhonchi or rales.     Comments: Coarse  lung sounds throughout. Chest:     Chest wall: No tenderness.  Musculoskeletal:     Cervical back: Normal range of motion and neck supple.  Lymphadenopathy:     Cervical: No cervical adenopathy.  Skin:    General: Skin is warm and dry.  Neurological:     General: No focal deficit present.     Mental Status: She is alert and oriented to person, place, and time.  Psychiatric:        Mood and Affect: Mood normal.        Behavior: Behavior normal.     Assessment and Plan :   PDMP not reviewed this encounter.  1. Acute bronchitis, unspecified organism    Start azithromycin to cover for bacterial bronchitis. Will use prednisone for the inflammatory, respiratory symptoms. Use supportive care otherwise. Counseled patient on potential for adverse effects with medications prescribed/recommended today, ER and return-to-clinic precautions discussed, patient verbalized understanding.    Wallis Bamberg, [place] 05/13/23 9629

## 2023-05-13 NOTE — Discharge Instructions (Addendum)
Start azithromycin to cover for bacterial bronchitis. Will use prednisone for the inflammatory, respiratory symptoms.  Please take Tylenol 650mg  once every 6 hours for fevers, aches and pains. Hydrate very well with at least 2 liters (64 ounces) of water. Eat light meals such as soups (chicken and noodles, chicken wild rice, vegetable).  Do not eat any foods that you are allergic to.  Start an antihistamine like Zyrtec (10mg  daily) for postnasal drainage, sinus congestion. Use cough syrup as needed.

## 2023-12-14 ENCOUNTER — Emergency Department (HOSPITAL_COMMUNITY): Admission: EM | Admit: 2023-12-14 | Discharge: 2023-12-15 | Disposition: A | Discharge status: Home / Self Care

## 2023-12-14 ENCOUNTER — Encounter (HOSPITAL_COMMUNITY): Payer: Self-pay

## 2023-12-14 ENCOUNTER — Other Ambulatory Visit: Payer: Self-pay

## 2023-12-14 DIAGNOSIS — L0291 Cutaneous abscess, unspecified: Secondary | ICD-10-CM

## 2023-12-14 DIAGNOSIS — R0989 Other specified symptoms and signs involving the circulatory and respiratory systems: Secondary | ICD-10-CM

## 2023-12-14 DIAGNOSIS — L02415 Cutaneous abscess of right lower limb: Secondary | ICD-10-CM | POA: Insufficient documentation

## 2023-12-14 DIAGNOSIS — L02413 Cutaneous abscess of right upper limb: Secondary | ICD-10-CM | POA: Diagnosis not present

## 2023-12-14 DIAGNOSIS — N898 Other specified noninflammatory disorders of vagina: Secondary | ICD-10-CM | POA: Insufficient documentation

## 2023-12-14 DIAGNOSIS — L041 Acute lymphadenitis of trunk: Secondary | ICD-10-CM | POA: Diagnosis not present

## 2023-12-14 DIAGNOSIS — R599 Enlarged lymph nodes, unspecified: Secondary | ICD-10-CM | POA: Diagnosis not present

## 2023-12-14 LAB — URINALYSIS, ROUTINE W REFLEX MICROSCOPIC
Bilirubin Urine: NEGATIVE
Glucose, UA: NEGATIVE mg/dL
Ketones, ur: NEGATIVE mg/dL
Leukocytes,Ua: NEGATIVE
Nitrite: NEGATIVE
Protein, ur: NEGATIVE mg/dL
Specific Gravity, Urine: 1.015 (ref 1.005–1.030)
pH: 6 (ref 5.0–8.0)

## 2023-12-14 LAB — WET PREP, GENITAL
Clue Cells Wet Prep HPF POC: NONE SEEN
Sperm: NONE SEEN
Trich, Wet Prep: NONE SEEN
WBC, Wet Prep HPF POC: <10 (ref <10)
Yeast Wet Prep HPF POC: NONE SEEN

## 2023-12-14 LAB — PREGNANCY, URINE: Preg Test, Ur: NEGATIVE

## 2023-12-14 MED ORDER — LIDOCAINE-EPINEPHRINE (PF) 2 %-1:200000 IJ SOLN
10.0000 mL | Freq: Once | INTRAMUSCULAR | Status: AC
  Administered 2023-12-15: 10 mL
  Filled 2023-12-14: qty 20

## 2023-12-14 MED ORDER — FLUCONAZOLE 150 MG PO TABS
150.0000 mg | ORAL_TABLET | Freq: Once | ORAL | Status: AC
  Administered 2023-12-15: 150 mg via ORAL
  Filled 2023-12-14: qty 1

## 2023-12-14 NOTE — ED Triage Notes (Signed)
 Pt has an abscess on right posterior thigh/buttock x 5 days and an abscess on left groin x 2 days. Pt also reported that she thinks she has a yeast infection with thick white vaginal discharge and mild burning with urination.

## 2023-12-15 DIAGNOSIS — L02415 Cutaneous abscess of right lower limb: Secondary | ICD-10-CM | POA: Diagnosis not present

## 2023-12-15 MED ORDER — DOXYCYCLINE HYCLATE 100 MG PO CAPS: 100.0000 mg | ORAL_CAPSULE | Freq: Two times a day (BID) | ORAL | 0 refills | Status: DC

## 2023-12-15 NOTE — ED Provider Notes (Signed)
 [place] EMERGENCY DEPARTMENT AT [facility] Provider Note   CSN: [id number] Arrival date & time: 12/14/23  2014     History  Chief Complaint  Patient presents with   Abscess    Michele Dixon is a 28 y.o. female.  Patient with no pertinent past medical history presents today with complaints of abscess. She states that same has been ongoing for the past 5 days. States she has had abscesses in this area previously, however they usually rupture on their own without intervention. She states that same is located on her right posterior thigh area. She also notes an additional area of swelling in her left groin that is tender. She is concerned that this is also an abscess. She denies fevers or chills. Notes that she is having cottage cheese like discharge and is concerned for a yeast infection as well. Denies abdominal pain, nausea, vomiting, or diarrhea.   The history is provided by the patient. No language interpreter was used.  Abscess      Home Medications Prior to Admission medications   Medication Sig Start Date End Date Taking? Authorizing Provider  azithromycin  (ZITHROMAX ) 250 MG tablet Day 1: take 2 tablets. Day 2-5: Take 1 tablet daily. 05/13/23   Adolph Hoop, PA-C  predniSONE  (DELTASONE ) 20 MG tablet Take 2 tablets daily with breakfast. 05/13/23   Adolph Hoop, PA-C  promethazine -dextromethorphan (PROMETHAZINE -DM) 6.25-15 MG/5ML syrup Take 5 mLs by mouth 3 (three) times daily as needed for cough. 05/13/23   Adolph Hoop, PA-C      Allergies    Patient has no known allergies.    Review of Systems   Review of Systems  All other systems reviewed and are negative.   Physical Exam Updated Vital Signs BP (!) 129/98 (BP Location: Right Arm)   Pulse 92   Temp 99.3 F (37.4 C) (Oral)   Resp 18   Ht 5\' 3"  (1.6 m)   Wt 81.6 kg   LMP  (LMP Unknown)   SpO2 100%   BMI 31.89 kg/m  Physical Exam Vitals and nursing note reviewed. Exam conducted with a chaperone  present.  Constitutional:      General: She is not in acute distress.    Appearance: Normal appearance. She is normal weight. She is not ill-appearing, toxic-appearing or diaphoretic.  HENT:     Head: Normocephalic and atraumatic.  Cardiovascular:     Rate and Rhythm: Normal rate.  Pulmonary:     Effort: Pulmonary effort is normal. No respiratory distress.  Abdominal:     General: Abdomen is flat.     Palpations: Abdomen is soft.     Tenderness: There is no abdominal tenderness.  Genitourinary:    Comments: Small amount of thick white discharge present in the vagina. No CMT, no adnexal tenderness.   Left groin with tender nonpulsatile nodular-like structure consistent with lymph node.  No fluctuance or induration.  No overlying skin changes.  No drainage.  Posterior thigh with centimeter by 1 cm area fluctuance with surrounding induration consistent with abscess.  No crepitus or active drainage.  Musculoskeletal:        General: Normal range of motion.     Cervical back: Normal range of motion.  Skin:    General: Skin is warm and dry.  Neurological:     General: No focal deficit present.     Mental Status: She is alert.  Psychiatric:        Mood and Affect: Mood normal.  Behavior: Behavior normal.     ED Results / Procedures / Treatments   Labs (all labs ordered are listed, but only abnormal results are displayed) Labs Reviewed  URINALYSIS, ROUTINE W REFLEX MICROSCOPIC - Abnormal; Notable for the following components:      Result Value   Hgb urine dipstick SMALL (*)    Bacteria, UA RARE (*)    All other components within normal limits  WET PREP, GENITAL  PREGNANCY, URINE  GC/CHLAMYDIA PROBE AMP ([place]) NOT AT [facility]    EKG None  Radiology No results found.  Procedures .Pelvic exam  Date/Time: 12/15/2023 12:10 AM  Performed by: Sherra Dk, PA-C Authorized by: Sherra Dk, PA-C  Consent: Verbal consent obtained. Risks and benefits: risks,  benefits and alternatives were discussed Consent given by: patient Patient understanding: patient states understanding of the procedure being performed Patient identity confirmed: verbally with patient Local anesthesia used: no  Anesthesia: Local anesthesia used: no  Sedation: Patient sedated: no  Patient tolerance: patient tolerated the procedure well with no immediate complications   .Incision and Drainage  Date/Time: 12/15/2023 12:10 AM  Performed by: Sherra Dk, PA-C Authorized by: Sherra Dk, PA-C   Consent:    Consent obtained:  Verbal   Consent given by:  Patient   Risks, benefits, and alternatives were discussed: yes     Risks discussed:  Bleeding, damage to other organs, infection, incomplete drainage and pain   Alternatives discussed:  No treatment, delayed treatment, alternative treatment, observation and referral Universal protocol:    Procedure explained and questions answered to patient or proxy's satisfaction: yes     Patient identity confirmed:  Verbally with patient Location:    Type:  Abscess   Size:  1 cm x 1 cm   Location:  Lower extremity   Lower extremity location:  Leg   Leg location:  R upper leg Pre-procedure details:    Skin preparation:  Povidone-iodine Sedation:    Sedation type:  None Anesthesia:    Anesthesia method:  Local infiltration   Local anesthetic:  Lidocaine  2% WITH epi Procedure type:    Complexity:  Simple Procedure details:    Incision types:  Cruciate   Wound management:  Probed and deloculated   Drainage:  Purulent   Drainage amount:  Moderate   Wound treatment:  Wound left open   Packing materials:  None Post-procedure details:    Procedure completion:  Tolerated well, no immediate complications     Medications Ordered in ED Medications  lidocaine -EPINEPHrine  (XYLOCAINE  W/EPI) 2 %-1:200000 (PF) injection 10 mL (has no administration in time range)  fluconazole  (DIFLUCAN ) tablet 150 mg (has no  administration in time range)    ED Course/ Medical Decision Making/ A&P                                 Medical Decision Making Amount and/or Complexity of Data Reviewed Labs: ordered.  Risk Prescription drug management.   Patient presents today with abscess to the right upper thigh x 5 days.  She is afebrile, nontoxic-appearing, and in no acute distress with reassuring vital signs. Physical exam reveals 1 cm x 1 cm area with fluctuance with surrounding induration amenable to incision and drainage per above procedure which was well tolerated.  Abscess was not large enough to warrant packing or drain,  wound recheck in 2 days. Encouraged home warm soaks and flushing.  Will also  send for doxycycline .  Patient also concerned for a yeast infection. Wet prep negative, however will cover with 1 dose of diflucan  given her symptoms.   Also endorsing painful area to her left groin area x 2 days. This area is nodular and tender. No fluctuance or induration, no drainage. Did visualize area with ultrasound, appears more consistent with an inflamed lymph node than an abscess. No concern for Fournier's. No crepitus. Emphasized importance of warm compresses and close follow-up to re-evaluate this area in a few days.  Evaluation and diagnostic testing in the emergency department does not suggest an emergent condition requiring admission or immediate intervention beyond what has been performed at this time.  Plan for discharge with close PCP follow-up.  Patient is understanding and amenable with plan, educated on red flag symptoms that would prompt immediate return.  Patient discharged in stable condition.  Final Clinical Impression(s) / ED Diagnoses Final diagnoses:  Abscess  Tender lymph node  Vaginal discharge    Rx / DC Orders ED Discharge Orders          Ordered    doxycycline  (VIBRAMYCIN ) 100 MG capsule  2 times daily        12/15/23 0017          An After Visit Summary was printed and  given to the patient.     Sherra Dk, PA-C 12/15/23 Detra Flower, DO 12/15/23 2240

## 2023-12-15 NOTE — ED Provider Notes (Incomplete)
  [facility] AT [facility] Provider Note   CSN: [id number] Arrival date & time: 12/14/23  2014     History {Add pertinent medical, surgical, social history, OB history to HPI:1} Chief Complaint  Patient presents with  . Abscess    Michele Dixon is a 28 y.o. female.  The history is provided by the patient. No language interpreter was used.  Abscess      Home Medications Prior to Admission medications   Medication Sig Start Date End Date Taking? Authorizing Provider  azithromycin  (ZITHROMAX ) 250 MG tablet Day 1: take 2 tablets. Day 2-5: Take 1 tablet daily. 05/13/23   Adolph Hoop, PA-C  predniSONE  (DELTASONE ) 20 MG tablet Take 2 tablets daily with breakfast. 05/13/23   Adolph Hoop, PA-C  promethazine -dextromethorphan (PROMETHAZINE -DM) 6.25-15 MG/5ML syrup Take 5 mLs by mouth 3 (three) times daily as needed for cough. 05/13/23   Adolph Hoop, PA-C      Allergies    Patient has no known allergies.    Review of Systems   Review of Systems  Physical Exam Updated Vital Signs BP (!) 129/98 (BP Location: Right Arm)   Pulse 92   Temp 99.3 F (37.4 C) (Oral)   Resp 18   Ht 5\' 3"  (1.6 m)   Wt 81.6 kg   LMP  (LMP Unknown)   SpO2 100%   BMI 31.89 kg/m  Physical Exam  ED Results / Procedures / Treatments   Labs (all labs ordered are listed, but only abnormal results are displayed) Labs Reviewed  URINALYSIS, ROUTINE W REFLEX MICROSCOPIC - Abnormal; Notable for the following components:      Result Value   Hgb urine dipstick SMALL (*)    Bacteria, UA RARE (*)    All other components within normal limits  WET PREP, GENITAL  PREGNANCY, URINE  GC/CHLAMYDIA PROBE AMP ([place]) NOT AT [facility]    EKG None  Radiology No results found.  Procedures Procedures  {Document cardiac monitor, telemetry assessment procedure when appropriate:1}  Medications Ordered in ED Medications  lidocaine -EPINEPHrine  (XYLOCAINE  W/EPI) 2 %-1:200000 (PF)  injection 10 mL (has no administration in time range)  fluconazole  (DIFLUCAN ) tablet 150 mg (has no administration in time range)    ED Course/ Medical Decision Making/ A&P   {   Click here for ABCD2, HEART and other calculatorsREFRESH Note before signing :1}                              Medical Decision Making Amount and/or Complexity of Data Reviewed Labs: ordered.  Risk Prescription drug management.   ***  {Document critical care time when appropriate:1} {Document review of labs and clinical decision tools ie heart score, Chads2Vasc2 etc:1}  {Document your independent review of radiology images, and any outside records:1} {Document your discussion with family members, caretakers, and with consultants:1} {Document social determinants of health affecting pt's care:1} {Document your decision making why or why not admission, treatments were needed:1} Final Clinical Impression(s) / ED Diagnoses Final diagnoses:  None    Rx / DC Orders ED Discharge Orders     None

## 2023-12-15 NOTE — Discharge Instructions (Signed)
 As we discussed, your right thigh abscess was opened and drained in the emergency department today.  This should heal on its own in the next few days.  In the interim, please clean it thoroughly with soap and water.  You can scrub it if you can tolerate it with pain.   I would like you to have the wound checked in 2-3 days. This can be done by any doctor's office, urgent care, or emergency department. This is to make sure the area hasn't closed too soon. Try to keep the area as clean and dry as possible.   I am placing you on a course of antibiotics. It is important you finish the entire course! You can take ibuprofen  or tylenol  as needed for pain.   You can use an antiseptic (chlorhexidine ) soap from the pharmacy 1-2 x per month in the areas where abscesses are most likely to form (armpits, buttocks, groin). This soap can dry your skin out so use it sparingly. Once do so once this area has fully healed.  I have also given you a referral to a general surgeon to follow-up with for removal given these areas continue to recur.  Please call them to schedule an appointment at your earliest convenience.  Additionally, it appears that the area in your left groin is a swollen lymph node.  I recommend that you place a warm compress on this area a few times every day.  This should resolve on its own in a few days.  When you follow-up for the wound check from your abscess, please have them take a look at this area as well to ensure that it is improving appropriately.  We also treated you empirically for a yeast infection with 1 dose of Diflucan  given the symptoms you are having.  Continue to monitor how you're doing and return to the ER for new or worsening symptoms.

## 2023-12-16 LAB — GC/CHLAMYDIA PROBE AMP (~~LOC~~) NOT AT ARMC
Chlamydia: NEGATIVE
Comment: NEGATIVE
Comment: NORMAL
Neisseria Gonorrhea: NEGATIVE

## 2024-03-05 ENCOUNTER — Ambulatory Visit: Admitting: General Practice

## 2024-04-06 DIAGNOSIS — R300 Dysuria: Secondary | ICD-10-CM | POA: Diagnosis not present

## 2024-04-06 DIAGNOSIS — I10 Essential (primary) hypertension: Secondary | ICD-10-CM | POA: Diagnosis not present

## 2024-04-06 DIAGNOSIS — B349 Viral infection, unspecified: Secondary | ICD-10-CM | POA: Diagnosis not present

## 2024-04-06 DIAGNOSIS — Z20822 Contact with and (suspected) exposure to covid-19: Secondary | ICD-10-CM | POA: Diagnosis not present

## 2024-04-06 DIAGNOSIS — K219 Gastro-esophageal reflux disease without esophagitis: Secondary | ICD-10-CM | POA: Diagnosis not present

## 2024-04-21 ENCOUNTER — Ambulatory Visit: Admitting: Nurse Practitioner

## 2024-04-21 ENCOUNTER — Encounter: Admitting: Nurse Practitioner

## 2024-04-21 ENCOUNTER — Ambulatory Visit (INDEPENDENT_AMBULATORY_CARE_PROVIDER_SITE_OTHER): Admitting: General Practice

## 2024-04-21 ENCOUNTER — Ambulatory Visit: Admitting: Sleep Medicine

## 2024-04-21 ENCOUNTER — Encounter: Payer: Self-pay | Admitting: General Practice

## 2024-04-21 VITALS — BP 128/64 | HR 80 | Temp 97.9°F | Ht 64.0 in | Wt 190.0 lb

## 2024-04-21 DIAGNOSIS — Z800 Family history of malignant neoplasm of digestive organs: Secondary | ICD-10-CM

## 2024-04-21 DIAGNOSIS — Z7689 Persons encountering health services in other specified circumstances: Secondary | ICD-10-CM | POA: Insufficient documentation

## 2024-04-21 DIAGNOSIS — K219 Gastro-esophageal reflux disease without esophagitis: Secondary | ICD-10-CM | POA: Diagnosis not present

## 2024-04-21 DIAGNOSIS — R0683 Snoring: Secondary | ICD-10-CM | POA: Diagnosis not present

## 2024-04-21 DIAGNOSIS — I10 Essential (primary) hypertension: Secondary | ICD-10-CM

## 2024-04-21 MED ORDER — OMEPRAZOLE 20 MG PO CPDR: 20.0000 mg | DELAYED_RELEASE_CAPSULE | Freq: Every day | ORAL | 0 refills | Status: DC

## 2024-04-21 NOTE — Assessment & Plan Note (Signed)
 EMR reviewed briefly.

## 2024-04-21 NOTE — Patient Instructions (Addendum)
 Start Omeprazole 20 mg once daily 20 mins before breakfast.   Start monitoring your blood pressure daily, around the same time of day, for the next 2-3 weeks.  Ensure that you have rested for 30 minutes prior to checking your blood pressure.   Record your readings and notify me if you see numbers consistently at or above 130 on top and/or 90 on bottom.  You will either be contacted via phone regarding your referral to pulmonology, or you may receive a letter on your MyChart portal from our referral team with instructions for scheduling an appointment. Please let us  know if you have not been contacted by anyone within two weeks.  Find out what age your father was diagnosed with colon cancer.   Follow up in 2 weeks.   It was a pleasure to meet you today! Please don't hesitate to contact me with any questions. Welcome to [facility]!

## 2024-04-21 NOTE — Progress Notes (Signed)
 New Dixon Office Visit  Subjective    Dixon ID: Michele Dixon, female    DOB: 26-Oct-1995  Age: 28 y.o. MRN: [id number]  CC:  Chief Complaint  Dixon presents with   New Dixon (Initial Visit)   Gastroesophageal Reflux    Dixon has been taking famotidine  20 mg BID but only works sometimes.    Sleep Apnea    Has never done sleep study or been treated. Patients boyfriend told her she snores but also stops breathing sometimes which wakes him up.     HPI Michele Dixon is a 28 y.o. female presents to establish care. Interpreter present: Katheryn and Therisa Previous PCP/physical/labs: [facility] 2 years ago; last physical and labs two years ago.  Discussed the use of AI scribe software for clinical note transcription with the Dixon, who gave verbal consent to proceed.  History of Present Illness Michele Dixon is a 28 year old female who presents with GERD and concerns about sleep apnea.  She experiences symptoms of GERD, including heartburn and a sensation of something being stuck in her throat. There is an odor when burping, and she has been taking Pepcid  twice daily, which she feels is only partially effective. She also experiences occasional cramping and nausea, along with a recent episode of diarrhea over the past week.  Her boyfriend has informed her that she snores and sometimes stops breathing during sleep, which has been a long-standing issue. She has not undergone a sleep study previously.   She has a history of high blood pressure, first noted at an urgent care visit where her blood pressure was recorded as 126/106 mmHg. She experiences headaches and blurred vision. Both of her parents have hypertension and are on medication.  There is a family history of colon cancer; her father was diagnosed about two years ago and is currently 31 years old, but the exact age at diagnosis is uncertain.    Outpatient Encounter Medications as of 04/21/2024  Medication Sig   famotidine   (PEPCID ) 20 MG tablet Take 20 mg by mouth daily.   levonorgestrel  (LILETTA ) 20.1 MCG/DAY IUD IUD 1 each by Intrauterine route once.   omeprazole (PRILOSEC) 20 MG capsule Take 1 capsule (20 mg total) by mouth daily.   [DISCONTINUED] azithromycin  (ZITHROMAX ) 250 MG tablet Day 1: take 2 tablets. Day 2-5: Take 1 tablet daily.   [DISCONTINUED] doxycycline  (VIBRAMYCIN ) 100 MG capsule Take 1 capsule (100 mg total) by mouth 2 (two) times daily.   [DISCONTINUED] predniSONE  (DELTASONE ) 20 MG tablet Take 2 tablets daily with breakfast.   [DISCONTINUED] promethazine -dextromethorphan (PROMETHAZINE -DM) 6.25-15 MG/5ML syrup Take 5 mLs by mouth 3 (three) times daily as needed for cough.   No facility-administered encounter medications on file as of 04/21/2024.    Past Medical History:  Diagnosis Date   Anemia    Anxiety    Cochlear implant in place    age 70   Exudative pharyngitis 01/01/2013   FUO (fever of unknown origin) 01/01/2013   GERD (gastroesophageal reflux disease)    Infection    UTI   Migraines    Ovarian cyst    ~6th grade   Sensorineural hearing loss (SNHL) of both ears    can read lips but unsure about medical terms   Sleep apnea    Syncope and collapse 01/01/2013    Past Surgical History:  Procedure Laterality Date   COCHLEAR IMPLANT Right age 61   FOREIGN BODY REMOVAL Left 10/29/2016   Procedure: EXCISION FOREIGN BODY LEFT ARM;  Surgeon: Herlene Righter Kinsinger, MD;  Location: [facility];  Service: General;  Laterality: Left;   NORPLANT  REMOVAL Left 05/15/2016   Procedure: Attempted REMOVAL OF Nexplanon ;  Surgeon: Harland JAYSON Birkenhead, MD;  Location: [facility];  Service: Gynecology;  Laterality: Left;  Left upper arm.    Family History  Problem Relation Age of Onset   Hypertension Mother    Hyperlipidemia Father    Prostate cancer Father    Pancreatitis Sister    Pancreatitis Maternal Aunt    Breast cancer Maternal Aunt    Pancreatitis Maternal Grandmother     Social  History   Socioeconomic History   Marital status: Single    Spouse name: Not on file   Number of children: Not on file   Years of education: Not on file   Highest education level: Associate degree: occupational, Scientist, product/process development, or vocational program  Occupational History   Not on file  Tobacco Use   Smoking status: Never   Smokeless tobacco: Never  Vaping Use   Vaping status: Never Used  Substance and Sexual Activity   Alcohol use: Not Currently    Comment: occasional   Drug use: No   Sexual activity: Yes    Birth control/protection: I.U.D.  Other Topics Concern   Not on file  Social History Narrative   Not on file   Social Drivers of Health   Financial Resource Strain: Low Risk  (04/18/2024)   Overall Financial Resource Strain (CARDIA)    Difficulty of Paying Living Expenses: Not very hard  Food Insecurity: No Food Insecurity (04/18/2024)   Hunger Vital Sign    Worried About Running Out of Food in the Last Year: Never true    Ran Out of Food in the Last Year: Never true  Transportation Needs: No Transportation Needs (04/18/2024)   PRAPARE - Administrator, Civil Service (Medical): No    Lack of Transportation (Non-Medical): No  Physical Activity: Insufficiently Active (04/18/2024)   Exercise Vital Sign    Days of Exercise per Week: 3 days    Minutes of Exercise per Session: 30 min  Stress: No Stress Concern Present (04/18/2024)   [employer] of Occupational Health - Occupational Stress Questionnaire    Feeling of Stress: Only a little  Social Connections: Moderately Integrated (04/18/2024)   Social Connection and Isolation Panel    Frequency of Communication with Friends and Family: More than three times a week    Frequency of Social Gatherings with Friends and Family: Once a week    Attends Religious Services: 1 to 4 times per year    Active Member of [employer] or Organizations: No    Attends Engineer, structural: Not on file    Marital Status: Living  with partner  Intimate Partner Violence: Not on file    Review of Systems  Constitutional:  Negative for chills and fever.  Eyes:  Positive for blurred vision.  Respiratory:  Negative for shortness of breath.   Cardiovascular:  Negative for chest pain.  Gastrointestinal:  Positive for diarrhea, heartburn and nausea. Negative for abdominal pain, constipation and vomiting.  Genitourinary:  Negative for dysuria, frequency and urgency.  Neurological:  Positive for headaches. Negative for dizziness.  Endo/Heme/Allergies:  Negative for polydipsia.  Psychiatric/Behavioral:  Negative for depression and suicidal ideas. The Dixon is not nervous/anxious.         Objective    BP 128/64   Pulse 80   Temp 97.9 F (36.6 C) (Oral)  Ht 5' 4 (1.626 m)   Wt 190 lb (86.2 kg)   LMP  (Exact Date)   SpO2 99%   BMI 32.61 kg/m   Physical Exam Vitals and nursing note reviewed.  Constitutional:      Appearance: Normal appearance.  Cardiovascular:     Rate and Rhythm: Normal rate and regular rhythm.     Pulses: Normal pulses.     Heart sounds: Normal heart sounds.  Pulmonary:     Effort: Pulmonary effort is normal.     Breath sounds: Normal breath sounds.  Neurological:     Mental Status: She is alert and oriented to person, place, and time.  Psychiatric:        Mood and Affect: Mood normal.        Behavior: Behavior normal.        Thought Content: Thought content normal.        Judgment: Judgment normal.         Assessment & Plan:  Snoring -     Pulmonary Visit  Establishing care with new doctor, encounter for Assessment & Plan: EMR reviewed briefly.    Gastroesophageal reflux disease, unspecified whether esophagitis present -     Omeprazole; Take 1 capsule (20 mg total) by mouth daily.  Dispense: 30 capsule; Refill: 0  Family history of colon cancer in father  Hypertension, unspecified type   Assessment and Plan Assessment & Plan Gastroesophageal reflux disease  (GERD) Current famotidine  treatment ineffective. Symptoms include heartburn, throat obstruction, odorous burping, cramping, nausea, and diarrhea. - Discontinue famotidine . - Start omeprazole 20 mg once daily, 20 minutes before breakfast. - Provide educational handout on dietary triggers for GERD. - Advise dietary modifications to avoid fried, greasy, spicy, and acidic foods. - Follow up in 2 weeks.   Elevated blood pressure Elevated blood pressure with symptoms of headaches and blurred vision. Family history of hypertension. - Provide handout on home blood pressure monitoring. - Instruct to check blood pressure daily at the same time using an arm cuff. - Advise reduction of salt intake. - Schedule follow-up appointment in 2 weeks to review blood pressure log and consider medication.  Suspected obstructive sleep apnea Suspected based on reports of snoring and apneic episodes during sleep. - Refer to pulmonology for sleep study. - Monitor MyChart for referral communication and follow up if no contact within 2 weeks.  General Health Maintenance Family history of colon cancer in father diagnosed at age 34. Screening colonoscopy timing depends on the exact age of father's diagnosis. - Instruct to confirm father's exact age at diagnosis for future screening recommendations.   Return in about 2 weeks (around 05/05/2024) for blood pressure and GERD.   Carrol Aurora, NP

## 2024-04-26 ENCOUNTER — Encounter: Payer: Self-pay | Admitting: Nurse Practitioner

## 2024-04-26 ENCOUNTER — Ambulatory Visit: Admitting: Nurse Practitioner

## 2024-04-26 VITALS — BP 136/90 | HR 80 | Temp 97.6°F | Ht 64.0 in | Wt 190.0 lb

## 2024-04-26 DIAGNOSIS — R0683 Snoring: Secondary | ICD-10-CM | POA: Diagnosis not present

## 2024-04-26 DIAGNOSIS — G4719 Other hypersomnia: Secondary | ICD-10-CM | POA: Diagnosis not present

## 2024-04-26 DIAGNOSIS — Z6832 Body mass index (BMI) 32.0-32.9, adult: Secondary | ICD-10-CM

## 2024-04-26 DIAGNOSIS — E66811 Obesity, class 1: Secondary | ICD-10-CM | POA: Diagnosis not present

## 2024-04-26 NOTE — Progress Notes (Signed)
 @Patient  ID: Harlene Rumble, female    DOB: [birth year], 28 y.o.   MRN: [id number]  Chief Complaint  Patient presents with   Sleep Apnea    Snoring, restless sleep and stops breathing during sleep.     Referring provider: Vincente Shivers, NP  HPI: 28 year old female, never smoker referred for sleep consult.   TEST/EVENTS:   04/26/2024: Today - sleep consult Discussed the use of AI scribe software for clinical note transcription with the patient, who gave verbal consent to proceed.  History of Present Illness Zainah Steven is a 28 year old female who presents with symptoms suggestive of sleep apnea.  Her partner has observed that she stops breathing during sleep and snores loudly, and has had to shake her awake at night. She does not recall waking up on her own due to snoring or gasping.  She experiences significant daytime tiredness, particularly in the mornings, and often wakes up with a headache. No history of falling asleep while driving, but she feels drowsy during long drives and avoids driving for extended periods because of this. Has no issues driving around town. No sleep parasomnias/paralysis. She wakes up a couple of times at night to use the bathroom.  She has tried using a snore guard purchased online to prevent snoring, but it was uncomfortable, often fell out during sleep, and caused jaw soreness. She believes it did not fit properly as it did not extend to the back of her mouth.  She goes to bed between 9-10 pm. Falls asleep quickly. Wakes 1-2 times a night. Gets up around 630 am. Does not operate any heavy machinery in her job Animal nutritionist. Weight is up from 140 to 190 lb in the last couple of years. Never had a prior sleep study.  Epworth 10    No Known Allergies  Immunization History  Administered Date(s) Administered   DTP 12/24/1993, 08/13/1994, 03/18/1995   DTaP 07/28/1995, 10/30/1995   HIB, Unspecified 12/24/1993, 08/13/1994, 03/18/1995, 07/28/1995   Hep B,  Unspecified January 06, 1994, 12/24/1993, 07/28/1995   Influenza Split 07/01/2007, 05/03/2008   Influenza,inj,Quad PF,6+ Mos 09/02/2017   MMR 10/30/1995, 10/14/1997   Meningococcal Conjugate 02/01/2011   OPV 12/24/1993, 08/13/1994, 07/28/1995   PFIZER(Purple Top)SARS-COV-2 Vaccination 10/25/2019, 11/16/2019, 08/09/2020   Pneumococcal Polysaccharide-23 03/22/2005, 04/14/2005   Polio, Unspecified 07/27/1997   Td 01/03/2005   Tdap 01/21/2018   Varicella 10/30/1995    Past Medical History:  Diagnosis Date   Anemia    Anxiety    Cochlear implant in place    age 2   Exudative pharyngitis 01/01/2013   FUO (fever of unknown origin) 01/01/2013   GERD (gastroesophageal reflux disease)    Infection    UTI   Migraines    Ovarian cyst    ~6th grade   Sensorineural hearing loss (SNHL) of both ears    can read lips but unsure about medical terms   Sleep apnea    Syncope and collapse 01/01/2013    Tobacco History: Social History   Tobacco Use  Smoking Status Never  Smokeless Tobacco Never   Counseling given: Not Answered   Outpatient Medications Prior to Visit  Medication Sig Dispense Refill   famotidine  (PEPCID ) 20 MG tablet Take 20 mg by mouth daily. (Patient not taking: Reported on 04/26/2024)     levonorgestrel  (LILETTA ) 20.1 MCG/DAY IUD IUD 1 each by Intrauterine route once.     omeprazole (PRILOSEC) 20 MG capsule Take 1 capsule (20 mg total) by mouth daily. 30 capsule 0  No facility-administered medications prior to visit.     Review of Systems: As above    Physical Exam:  BP (!) 136/90   Pulse 80   Temp 97.6 F (36.4 C) (Temporal)   Ht 5' 4 (1.626 m)   Wt 190 lb (86.2 kg)   SpO2 97%   BMI 32.61 kg/m   GEN: Pleasant, interactive, well-appearing; obese; in no acute distress HEENT:  Normocephalic and atraumatic. PERRLA. Sclera white. Nasal turbinates pink, moist and patent bilaterally. No rhinorrhea present. Oropharynx pink and moist, without exudate or edema. No  lesions, ulcerations, or postnasal drip.  NECK:  Supple w/ fair ROM. Thyroid symmetrical with no goiter or nodules palpated. No lymphadenopathy.   CV: RRR, no m/r/g PULMONARY:  Unlabored, regular breathing. Clear bilaterally A&P w/o wheezes/rales/rhonchi. No accessory muscle use.  GI: BS present and normoactive. Soft, non-tender to palpation.  MSK: No erythema, warmth or tenderness. Neuro: A/Ox3. No focal deficits noted.   Skin: Warm, no lesions or rashe Psych: Normal affect and behavior. Judgement and thought content appropriate.     Lab Results:  CBC    Component Value Date/Time   WBC 9.9 10/08/2022 1834   RBC 4.14 10/08/2022 1834   HGB 12.6 10/08/2022 1834   HGB 10.2 (L) 10/29/2017 0825   HCT 39.2 10/08/2022 1834   HCT 31.2 (L) 10/29/2017 0825   PLT 214 10/08/2022 1834   PLT 194 10/29/2017 0825   MCV 94.7 10/08/2022 1834   MCV 93 10/29/2017 0825   MCH 30.4 10/08/2022 1834   MCHC 32.1 10/08/2022 1834   RDW 12.7 10/08/2022 1834   RDW 13.7 10/29/2017 0825   LYMPHSABS 2.1 10/08/2022 1834   LYMPHSABS 1.7 08/05/2017 1018   MONOABS 0.7 10/08/2022 1834   EOSABS 0.2 10/08/2022 1834   EOSABS 0.1 08/05/2017 1018   BASOSABS 0.0 10/08/2022 1834   BASOSABS 0.0 08/05/2017 1018    BMET    Component Value Date/Time   NA 135 10/08/2022 1834   K 3.6 10/08/2022 1834   CL 102 10/08/2022 1834   CO2 25 10/08/2022 1834   GLUCOSE 142 (H) 10/08/2022 1834   BUN 9 10/08/2022 1834   CREATININE 0.69 10/08/2022 1834   CALCIUM 8.4 (L) 10/08/2022 1834   GFRNONAA >60 10/08/2022 1834   GFRAA >60 07/24/2017 1422    BNP No results found for: BNP   Imaging:  No results found.  Administration History     None           No data to display          No results found for: NITRICOXIDE      Assessment & Plan:   Excessive daytime sleepiness She has snoring, excessive daytime sleepiness, nocturnal apneic events, morning headaches, restless sleep. BMI 32. History of  migraine headaches. Epworth 10. Given this,  I am concerned she could have sleep disordered breathing with obstructive sleep apnea. She will need sleep study for further evaluation.    - discussed how weight can impact sleep and risk for sleep disordered breathing - discussed options to assist with weight loss: combination of diet modification, cardiovascular and strength training exercises   - had an extensive discussion regarding the adverse health consequences related to untreated sleep disordered breathing - specifically discussed the risks for hypertension, coronary artery disease, cardiac dysrhythmias, cerebrovascular disease, and diabetes - lifestyle modification discussed   - discussed how sleep disruption can increase risk of accidents, particularly when driving - safe driving practices were discussed  Patient  Instructions  Given your symptoms, I am concerned that you may have sleep disordered breathing with sleep apnea. You will need a sleep study for further evaluation. Someone will contact you to schedule this.   We discussed how untreated sleep apnea puts an individual at risk for cardiac arrhthymias, pulm HTN, DM, stroke and increases their risk for daytime accidents. We also briefly reviewed treatment options including weight loss, side sleeping position, oral appliance, CPAP therapy or referral to ENT for possible surgical options  Use caution when driving and pull over if you become sleepy.  Follow up in 6 weeks with Katie Lenell Lama,NP to go over sleep study results, or sooner, if needed. Friday PM virtual clinic preferred       Snoring See above  Obesity (BMI 30.0-34.9) BMI 32. Healthy weight loss encouraged   Advised if symptoms do not improve or worsen, to please contact office for sooner follow up or seek emergency care.   I spent 35 minutes of dedicated to the care of this patient on the date of this encounter to include pre-visit review of records, face-to-face  time with the patient discussing conditions above, post visit ordering of testing, clinical documentation with the electronic health record, making appropriate referrals as documented, and communicating necessary findings to members of the patients care team.  Comer LULLA Rouleau, NP 04/26/2024  Pt aware and understands NP's role.

## 2024-04-26 NOTE — Patient Instructions (Signed)
 Given your symptoms, I am concerned that you may have sleep disordered breathing with sleep apnea. You will need a sleep study for further evaluation. Someone will contact you to schedule this.   We discussed how untreated sleep apnea puts an individual at risk for cardiac arrhthymias, pulm HTN, DM, stroke and increases their risk for daytime accidents. We also briefly reviewed treatment options including weight loss, side sleeping position, oral appliance, CPAP therapy or referral to ENT for possible surgical options  Use caution when driving and pull over if you become sleepy.  Follow up in 6 weeks with Michele Reda Gettis,NP to go over sleep study results, or sooner, if needed. Friday PM virtual clinic preferred

## 2024-04-26 NOTE — Assessment & Plan Note (Signed)
 She has snoring, excessive daytime sleepiness, nocturnal apneic events, morning headaches, restless sleep. BMI 32. History of migraine headaches. Epworth 10. Given this,  I am concerned she could have sleep disordered breathing with obstructive sleep apnea. She will need sleep study for further evaluation.    - discussed how weight can impact sleep and risk for sleep disordered breathing - discussed options to assist with weight loss: combination of diet modification, cardiovascular and strength training exercises   - had an extensive discussion regarding the adverse health consequences related to untreated sleep disordered breathing - specifically discussed the risks for hypertension, coronary artery disease, cardiac dysrhythmias, cerebrovascular disease, and diabetes - lifestyle modification discussed   - discussed how sleep disruption can increase risk of accidents, particularly when driving - safe driving practices were discussed  Patient Instructions  Given your symptoms, I am concerned that you may have sleep disordered breathing with sleep apnea. You will need a sleep study for further evaluation. Someone will contact you to schedule this.   We discussed how untreated sleep apnea puts an individual at risk for cardiac arrhthymias, pulm HTN, DM, stroke and increases their risk for daytime accidents. We also briefly reviewed treatment options including weight loss, side sleeping position, oral appliance, CPAP therapy or referral to ENT for possible surgical options  Use caution when driving and pull over if you become sleepy.  Follow up in 6 weeks with Katie Carleta Woodrow,NP to go over sleep study results, or sooner, if needed. Friday PM virtual clinic preferred

## 2024-04-26 NOTE — Assessment & Plan Note (Signed)
 BMI 32. Healthy weight loss encouraged.

## 2024-04-26 NOTE — Assessment & Plan Note (Signed)
 See above.

## 2024-05-03 ENCOUNTER — Telehealth: Payer: Self-pay

## 2024-05-03 NOTE — Telephone Encounter (Signed)
 Patient has been advised with interpreter

## 2024-05-03 NOTE — Telephone Encounter (Signed)
 Looks like she's being seen for her F/;u; okay to do form at visit?

## 2024-05-03 NOTE — Telephone Encounter (Signed)
 Copied from CRM [id number]. 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 Please call back to advise if these additional items can take place at the pt's appt on 10/15.

## 2024-05-05 ENCOUNTER — Encounter: Payer: Self-pay | Admitting: General Practice

## 2024-05-05 ENCOUNTER — Ambulatory Visit: Admitting: General Practice

## 2024-05-05 VITALS — BP 118/84 | HR 86 | Temp 98.3°F | Ht 64.0 in | Wt 192.0 lb

## 2024-05-05 DIAGNOSIS — E6609 Other obesity due to excess calories: Secondary | ICD-10-CM

## 2024-05-05 DIAGNOSIS — E66811 Obesity, class 1: Secondary | ICD-10-CM | POA: Insufficient documentation

## 2024-05-05 DIAGNOSIS — Z6832 Body mass index (BMI) 32.0-32.9, adult: Secondary | ICD-10-CM

## 2024-05-05 DIAGNOSIS — I10 Essential (primary) hypertension: Secondary | ICD-10-CM | POA: Diagnosis not present

## 2024-05-05 DIAGNOSIS — R0683 Snoring: Secondary | ICD-10-CM

## 2024-05-05 DIAGNOSIS — Z111 Encounter for screening for respiratory tuberculosis: Secondary | ICD-10-CM

## 2024-05-05 DIAGNOSIS — K219 Gastro-esophageal reflux disease without esophagitis: Secondary | ICD-10-CM

## 2024-05-05 DIAGNOSIS — E669 Obesity, unspecified: Secondary | ICD-10-CM | POA: Insufficient documentation

## 2024-05-05 NOTE — Progress Notes (Signed)
 Established Patient Office Visit  Subjective   Patient ID: Michele Dixon, female    DOB: 03-Jul-1996  Age: 28 y.o. MRN: [id number]  Chief Complaint  Patient presents with   Hypertension    Patient following up on BP; hasn't been taking it due to just now finding BP cuff at home.    Gastroesophageal Reflux    Patient following up on GERD and is taking omeprazole and avoiding spicy foods. Patient is doing better.     HPI  Michele Dixon is a 28 year old female with past medical history of GERD, cochlear implant in place, congenital anomaly of ear ossicles, bilateral sensorineural hearing loss, migraines, obesity presents today for a follow up.   Language assistance services were used to assist the patient with translation, provided by Katheryn  Discussed the use of AI scribe software for clinical note transcription with the patient, who gave verbal consent to proceed.  History of Present Illness Glendell Dixon is a 28 year old female with hypertension who presents for a follow-up on her blood pressure management.  She has been managing her hypertension and initially faced challenges in monitoring her blood pressure at home due to a missing cuff and dead batteries. She will start checking her BP at home. She denies any blurred vision, chest pain, shortness of breath or difficulty breathing.  She experiences loud snoring and was referred to a pulmonologist who recommended a home sleep study to diagnose potential sleep apnea. she is awaiting the arrival of the test kit.  She is concerned about her weight, noting that her BMI is 32.96, placing her in the obese range. She is concerned about her weight and discussed her BMI and exercise habits. She currently walks her dog daily but does not engage in other forms of exercise.  She is currently taking Prilosec for GERD, which is helping, especially as she is avoiding spicy foods. She does not require a refill at this time.    Patient Active  Problem List   Diagnosis Date Noted   Class 1 obesity due to excess calories with body mass index (BMI) of 32.0 to 32.9 in adult 05/05/2024   Hypertension 05/05/2024   Excessive daytime sleepiness 04/26/2024   Obesity (BMI 30.0-34.9) 04/26/2024   Snoring 04/21/2024   Gastroesophageal reflux disease 04/21/2024   Establishing care with new doctor, encounter for 04/21/2024   Family history of colon cancer in father 04/21/2024   Liletta  IUD (intrauterine device) in place since 04/01/2018 04/01/2018   Uses sign language interpreter 08/05/2017   Bilateral sensorineural hearing loss 06/07/2013   Cochlear implant in place 01/01/2013   Migraine headache 07/05/2008   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   Past Medical History:  Diagnosis Date   Anemia    Anxiety    Cochlear implant in place    age 33   Exudative pharyngitis 01/01/2013   FUO (fever of unknown origin) 01/01/2013   GERD (gastroesophageal reflux disease)    Infection    UTI   Migraines    Ovarian cyst    ~6th grade   Sensorineural hearing loss (SNHL) of both ears    can read lips but unsure about medical terms   Sleep apnea    Syncope and collapse 01/01/2013   Past Surgical History:  Procedure Laterality Date   COCHLEAR IMPLANT Right age 28   FOREIGN BODY REMOVAL Left 10/29/2016   Procedure: EXCISION FOREIGN BODY LEFT ARM;  Surgeon: Herlene Beverley Bureau, MD;  Location: [facility];  Service: General;  Laterality: Left;   NORPLANT  REMOVAL Left 05/15/2016   Procedure: Attempted REMOVAL OF Nexplanon ;  Surgeon: Harland JAYSON Birkenhead, MD;  Location: [facility];  Service: Gynecology;  Laterality: Left;  Left upper arm.   Social History   Tobacco Use   Smoking status: Never   Smokeless tobacco: Never  Vaping Use   Vaping status: Never Used  Substance Use Topics   Alcohol use: Not Currently    Comment: occasional   Drug use: No         05/05/2024   11:20 AM 04/21/2024    8:54 AM 09/18/2021   10:24 AM   Depression screen PHQ 2/9  Decreased Interest 0 2 0  Down, Depressed, Hopeless 0 0 0  PHQ - 2 Score 0 2 0  Altered sleeping 1 1 0  Tired, decreased energy 1 1 0  Change in appetite 1 1 0  Feeling bad or failure about yourself  0 0 0  Trouble concentrating 1 1 0  Moving slowly or fidgety/restless 0 1 0  Suicidal thoughts 0 0   PHQ-9 Score 4 7 0  Difficult doing work/chores Not difficult at all Not difficult at all        05/05/2024   11:21 AM 04/21/2024    8:55 AM 09/18/2021   10:25 AM 04/12/2016   11:55 AM  GAD 7 : Generalized Anxiety Score  Nervous, Anxious, on Edge 0 0 0 2  Control/stop worrying 0 0 0 2  Worry too much - different things 0 0 0 2  Trouble relaxing 0 0 0 1  Restless 0 0 0 1  Easily annoyed or irritable 0 0 0 3  Afraid - awful might happen 0 0 0 2  Total GAD 7 Score 0 0 0 13  Anxiety Difficulty Not difficult at all Not difficult at all        Review of Systems  Constitutional:  Negative for chills and fever.  Respiratory:  Negative for shortness of breath.   Cardiovascular:  Negative for chest pain.  Gastrointestinal:  Negative for abdominal pain, constipation, diarrhea, heartburn, nausea and vomiting.  Genitourinary:  Negative for dysuria, frequency and urgency.  Neurological:  Negative for dizziness and headaches.  Endo/Heme/Allergies:  Negative for polydipsia.  Psychiatric/Behavioral:  Negative for depression and suicidal ideas. The patient is not nervous/anxious.       Objective:     BP 118/84   Pulse 86   Temp 98.3 F (36.8 C) (Temporal)   Ht 5' 4 (1.626 m)   Wt 192 lb (87.1 kg)   SpO2 99%   BMI 32.96 kg/m     Physical Exam Vitals and nursing note reviewed.  Constitutional:      Appearance: Normal appearance.  Eyes:     Conjunctiva/sclera: Conjunctivae normal.     Pupils: Pupils are equal, round, and reactive to light.  Cardiovascular:     Rate and Rhythm: Normal rate and regular rhythm.     Pulses: Normal pulses.     Heart  sounds: Normal heart sounds.  Pulmonary:     Effort: Pulmonary effort is normal.     Breath sounds: Normal breath sounds.  Neurological:     Mental Status: She is alert and oriented to person, place, and time.  Psychiatric:        Mood and Affect: Mood normal.        Behavior: Behavior normal.        Thought Content: Thought  content normal.        Judgment: Judgment normal.      No results found for any visits on 05/05/24.     The ASCVD Risk score (Arnett DK, et al., 2019) failed to calculate for the following reasons:   The 2019 ASCVD risk score is only valid for ages 64 to 63    Assessment & Plan:  Gastroesophageal reflux disease, unspecified whether esophagitis present  Hypertension, unspecified type  Class 1 obesity due to excess calories with body mass index (BMI) of 32.0 to 32.9 in adult, unspecified whether serious comorbidity present -     Amb Ref to Medical Weight Management  Snoring    Assessment and Plan Assessment & Plan Essential hypertension Blood pressure was high previously but normal today. Home readings unavailable. - Monitor blood pressure at home and send readings via MyChart, ensuring readings are taken at the same time daily after 30 minutes of relaxation.  Obesity BMI is 32.96. Weight loss recommended to manage blood pressure, snoring, sleep apnea, and GERD. - [facility]er to [facility] in [place] for assistance with weight loss and exercise routines.  Suspected obstructive sleep apnea Snoring and potential sleep apnea symptoms present. Home sleep study required for diagnosis. Weight loss and dietary modifications advised. - Proceed with the home sleep study, - Implement weight loss and dietary modifications as advised by the pulmonologist.  Gastroesophageal reflux disease (GERD) GERD symptoms improving with Prilosec and dietary modifications. - Continue Prilosec. - Continue avoiding spicy foods.  Form filled out for work.     Return in about 4 weeks (around 06/02/2024) for physical and fasting labs.SABRA Carrol Aurora, NP

## 2024-05-05 NOTE — Addendum Note (Signed)
 Addended by: TENNIE RAISIN B on: 05/05/2024 12:03 PM   Modules accepted: Orders

## 2024-05-05 NOTE — Patient Instructions (Addendum)
 You will either be contacted via phone regarding your referral to healthy weight and wellness visit, or you may receive a letter on your MyChart portal from our referral team with instructions for scheduling an appointment. Please let us  know if you have not been contacted by anyone within two weeks.  Continue Prilosec 20 mg once daily.   Start monitoring your blood pressure daily, around the same time of day, for the next 2-3 weeks.  Ensure that you have rested for 30 minutes prior to checking your blood pressure.   Record your readings and notify me if you see numbers consistently at or above 130 on top and/or 90 on bottom.  Send them to me via mychart in two weeks.   Follow up in 4 weeks for physical and fasting for four hour priors to the appointment. Only water or black coffee.  It was a pleasure to see you today!

## 2024-05-07 ENCOUNTER — Ambulatory Visit (INDEPENDENT_AMBULATORY_CARE_PROVIDER_SITE_OTHER)

## 2024-05-07 DIAGNOSIS — Z111 Encounter for screening for respiratory tuberculosis: Secondary | ICD-10-CM

## 2024-05-07 LAB — TB SKIN TEST
Induration: 0 mm
TB Skin Test: NEGATIVE

## 2024-05-07 NOTE — Progress Notes (Signed)
 PPD Reading Note PPD read and results entered in EpicCare. Result: 0 mm induration. Interpretation: negative If test not read within 48-72 hours of initial placement, patient advised to repeat in other arm 1-3 weeks after this test. Allergic reaction: no

## 2024-05-16 ENCOUNTER — Other Ambulatory Visit: Payer: Self-pay | Admitting: General Practice

## 2024-05-16 DIAGNOSIS — K219 Gastro-esophageal reflux disease without esophagitis: Secondary | ICD-10-CM

## 2024-06-01 ENCOUNTER — Ambulatory Visit: Payer: Self-pay | Admitting: General Practice

## 2024-06-01 ENCOUNTER — Encounter: Payer: Self-pay | Admitting: General Practice

## 2024-06-01 ENCOUNTER — Ambulatory Visit: Admitting: General Practice

## 2024-06-01 VITALS — BP 110/80 | HR 75 | Temp 98.3°F | Ht 64.0 in | Wt 195.0 lb

## 2024-06-01 DIAGNOSIS — E66811 Obesity, class 1: Secondary | ICD-10-CM | POA: Diagnosis not present

## 2024-06-01 DIAGNOSIS — K219 Gastro-esophageal reflux disease without esophagitis: Secondary | ICD-10-CM

## 2024-06-01 DIAGNOSIS — Z124 Encounter for screening for malignant neoplasm of cervix: Secondary | ICD-10-CM | POA: Diagnosis not present

## 2024-06-01 DIAGNOSIS — Z0000 Encounter for general adult medical examination without abnormal findings: Secondary | ICD-10-CM | POA: Diagnosis not present

## 2024-06-01 DIAGNOSIS — E6609 Other obesity due to excess calories: Secondary | ICD-10-CM

## 2024-06-01 DIAGNOSIS — Z23 Encounter for immunization: Secondary | ICD-10-CM

## 2024-06-01 DIAGNOSIS — I10 Essential (primary) hypertension: Secondary | ICD-10-CM | POA: Diagnosis not present

## 2024-06-01 DIAGNOSIS — Z975 Presence of (intrauterine) contraceptive device: Secondary | ICD-10-CM | POA: Diagnosis not present

## 2024-06-01 DIAGNOSIS — Z6833 Body mass index (BMI) 33.0-33.9, adult: Secondary | ICD-10-CM

## 2024-06-01 DIAGNOSIS — R0683 Snoring: Secondary | ICD-10-CM | POA: Diagnosis not present

## 2024-06-01 LAB — CBC
HCT: 39.3 % (ref 36.0–46.0)
Hemoglobin: 13 g/dL (ref 12.0–15.0)
MCHC: 33 g/dL (ref 30.0–36.0)
MCV: 91.4 fl (ref 78.0–100.0)
Platelets: 232 K/uL (ref 150.0–400.0)
RBC: 4.3 Mil/uL (ref 3.87–5.11)
RDW: 13.1 % (ref 11.5–15.5)
WBC: 6.2 K/uL (ref 4.0–10.5)

## 2024-06-01 LAB — COMPREHENSIVE METABOLIC PANEL WITH GFR
ALT: 19 U/L (ref 0–35)
AST: 16 U/L (ref 0–37)
Albumin: 4.1 g/dL (ref 3.5–5.2)
Alkaline Phosphatase: 49 U/L (ref 39–117)
BUN: 10 mg/dL (ref 6–23)
CO2: 29 meq/L (ref 19–32)
Calcium: 8.9 mg/dL (ref 8.4–10.5)
Chloride: 103 meq/L (ref 96–112)
Creatinine, Ser: 0.7 mg/dL (ref 0.40–1.20)
GFR: 115.88 mL/min (ref >60.00)
Glucose, Bld: 76 mg/dL (ref 70–99)
Potassium: 4.1 meq/L (ref 3.5–5.1)
Sodium: 137 meq/L (ref 135–145)
Total Bilirubin: 0.6 mg/dL (ref 0.2–1.2)
Total Protein: 7.1 g/dL (ref 6.0–8.3)

## 2024-06-01 LAB — LIPID PANEL
Cholesterol: 136 mg/dL (ref 0–200)
HDL: 44.6 mg/dL (ref >39.00)
LDL Cholesterol: 83 mg/dL (ref 0–99)
NonHDL: 91.3
Total CHOL/HDL Ratio: 3
Triglycerides: 44 mg/dL (ref 0.0–149.0)
VLDL: 8.8 mg/dL (ref 0.0–40.0)

## 2024-06-01 LAB — TSH: TSH: 0.87 u[IU]/mL (ref 0.35–5.50)

## 2024-06-01 NOTE — Assessment & Plan Note (Signed)
 Discussed the importance of healthy diet and exercise to affect sustainable weight loss.   Referral information given for healthy weight and wellness.

## 2024-06-01 NOTE — Patient Instructions (Addendum)
 Stop by the lab prior to leaving today. I will notify you of your results once received.    You will either be contacted via phone regarding your referral to GYN , or you may receive a letter on your MyChart portal from our referral team with instructions for scheduling an appointment. Please let us  know if you have not been contacted by anyone within two weeks.   Call and schedule appointment with healthy weight and wellness.   Let me know if you don't hear back about the sleep study.   Follow up in one year for physical.   It was a pleasure to see you today!

## 2024-06-01 NOTE — Assessment & Plan Note (Signed)
 Asked patient to follow up with pulmonologist regarding schedule sleep study.   I personally also sent message to pulmonologist for follow up.

## 2024-06-01 NOTE — Assessment & Plan Note (Signed)
 Immunizations tdap UTD; unsure about HPV; prevnar 20 given today; declines influenza vaccine. Pap smear due; referral placed.  Discussed the importance of a healthy diet and regular exercise in order for weight loss, and to reduce the risk of further co-morbidity.  Exam stable. Labs pending.  Follow up in 1 year for repeat physical.

## 2024-06-01 NOTE — Progress Notes (Signed)
 Established Patient Office Visit  Subjective   Patient ID: Michele Dixon, female    DOB: 1996-01-18  Age: 28 y.o. MRN: [id number]  Chief Complaint  Patient presents with   Annual Exam    With fasting labs; and patient is fasting today.    Referral    Needs new OBGYN; has IUD and needs PAP    HPI  Michele Dixon is a 28 year old female with past medical history of HTN, bilateral sensorineural hearing loss, congenital anomaly of ear ossicles, GERD, migraines, cochlear implace, obesity, OSA presents today for complete physical and follow up of chronic conditions.  Language assistance services were used to assist the patient with translation, provided by Burnard.  Immunizations: -Tetanus: Completed in 2019 -Influenza: due; declines -Pneumonia: due -HPV: unsure  Diet: Fair diet.  Exercise: No regular exercise. Walks dog daily three times a week 30 mins - 60 mins.  Eye exam: Completed several years ago. Dental exam: Completes semi-annually    Pap Smear: Completed in 2021; due  Patient Active Problem List   Diagnosis Date Noted   Encounter for screening and preventative care 06/01/2024   Screening for cervical cancer 06/01/2024   Obesity 05/05/2024   Hypertension 05/05/2024   Excessive daytime sleepiness 04/26/2024   Snoring 04/21/2024   Gastroesophageal reflux disease 04/21/2024   Liletta  IUD (intrauterine device) in place since 04/01/2018 04/01/2018   Uses sign language interpreter 08/05/2017   Bilateral sensorineural hearing loss 06/07/2013   Cochlear implant in place 01/01/2013   Migraine headache 07/05/2008   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   Past Medical History:  Diagnosis Date   Anemia    Anxiety    Cochlear implant in place    age 37   Exudative pharyngitis 01/01/2013   FUO (fever of unknown origin) 01/01/2013   GERD (gastroesophageal reflux disease)    Infection    UTI   Migraines    Ovarian cyst    ~6th grade   Sensorineural hearing loss  (SNHL) of both ears    can read lips but unsure about medical terms   Sleep apnea    Syncope and collapse 01/01/2013   Past Surgical History:  Procedure Laterality Date   COCHLEAR IMPLANT Right age 69   FOREIGN BODY REMOVAL Left 10/29/2016   Procedure: EXCISION FOREIGN BODY LEFT ARM;  Surgeon: Herlene Beverley Bureau, MD;  Location: [facility];  Service: General;  Laterality: Left;   NORPLANT  REMOVAL Left 05/15/2016   Procedure: Attempted REMOVAL OF Nexplanon ;  Surgeon: Harland JAYSON Birkenhead, MD;  Location: [facility];  Service: Gynecology;  Laterality: Left;  Left upper arm.   No Known Allergies       05/05/2024   11:20 AM 04/21/2024    8:54 AM 09/18/2021   10:24 AM  Depression screen PHQ 2/9  Decreased Interest 0 2 0  Down, Depressed, Hopeless 0 0 0  PHQ - 2 Score 0 2 0  Altered sleeping 1 1 0  Tired, decreased energy 1 1 0  Change in appetite 1 1 0  Feeling bad or failure about yourself  0 0 0  Trouble concentrating 1 1 0  Moving slowly or fidgety/restless 0 1 0  Suicidal thoughts 0 0   PHQ-9 Score 4  7  0   Difficult doing work/chores Not difficult at all Not difficult at all      Data saved with a previous flowsheet row definition       05/05/2024   11:21  AM 04/21/2024    8:55 AM 09/18/2021   10:25 AM 04/12/2016   11:55 AM  GAD 7 : Generalized Anxiety Score  Nervous, Anxious, on Edge 0 0 0 2  Control/stop worrying 0 0 0 2  Worry too much - different things 0 0 0 2  Trouble relaxing 0 0 0 1  Restless 0 0 0 1  Easily annoyed or irritable 0 0 0 3  Afraid - awful might happen 0 0 0 2  Total GAD 7 Score 0 0 0 13  Anxiety Difficulty Not difficult at all Not difficult at all        Review of Systems  Constitutional:  Negative for chills, fever, malaise/fatigue and weight loss.  HENT:  Negative for congestion, ear discharge, ear pain, hearing loss, nosebleeds, sinus pain, sore throat and tinnitus.   Eyes:  Negative for blurred vision, double vision, pain, discharge and  redness.  Respiratory:  Negative for cough, shortness of breath, wheezing and stridor.   Cardiovascular:  Negative for chest pain, palpitations and leg swelling.  Gastrointestinal:  Negative for abdominal pain, constipation, diarrhea, heartburn, nausea and vomiting.  Genitourinary:  Negative for dysuria, frequency and urgency.  Musculoskeletal:  Negative for myalgias.  Skin:  Negative for rash.  Neurological:  Negative for dizziness, tingling, seizures, weakness and headaches.  Endo/Heme/Allergies:  Negative for polydipsia.  Psychiatric/Behavioral:  Negative for depression, substance abuse and suicidal ideas. The patient is not nervous/anxious.       Objective:     BP 110/80   Pulse 75   Temp 98.3 F (36.8 C) (Oral)   Ht 5' 4 (1.626 m)   Wt 195 lb (88.5 kg)   SpO2 99%   BMI 33.47 kg/m  BP Readings from Last 3 Encounters:  06/01/24 110/80  05/05/24 118/84  04/26/24 (!) 136/90   Wt Readings from Last 3 Encounters:  06/01/24 195 lb (88.5 kg)  05/05/24 192 lb (87.1 kg)  04/26/24 190 lb (86.2 kg)      Physical Exam Vitals and nursing note reviewed.  Constitutional:      Appearance: Normal appearance.  HENT:     Head: Normocephalic and atraumatic.     Right Ear: Tympanic membrane, ear canal and external ear normal.     Left Ear: Tympanic membrane, ear canal and external ear normal.     Nose: Nose normal.     Mouth/Throat:     Mouth: Mucous membranes are moist.     Pharynx: Oropharynx is clear.  Eyes:     Conjunctiva/sclera: Conjunctivae normal.     Pupils: Pupils are equal, round, and reactive to light.  Cardiovascular:     Rate and Rhythm: Normal rate and regular rhythm.     Pulses: Normal pulses.     Heart sounds: Normal heart sounds.  Pulmonary:     Effort: Pulmonary effort is normal.     Breath sounds: Normal breath sounds.  Abdominal:     General: Abdomen is flat. Bowel sounds are normal.     Palpations: Abdomen is soft.  Musculoskeletal:        General:  Normal range of motion.     Cervical back: Normal range of motion.  Skin:    General: Skin is warm and dry.     Capillary Refill: Capillary refill takes less than 2 seconds.  Neurological:     General: No focal deficit present.     Mental Status: She is alert and oriented to person, place, and time. Mental  status is at baseline.  Psychiatric:        Mood and Affect: Mood normal.        Behavior: Behavior normal.        Thought Content: Thought content normal.        Judgment: Judgment normal.      No results found for any visits on 06/01/24.     The ASCVD Risk score (Arnett DK, et al., 2019) failed to calculate for the following reasons:   The 2019 ASCVD risk score is only valid for ages 58 to 84    Assessment & Plan:  Encounter for screening and preventative care Assessment & Plan: Immunizations tdap UTD; unsure about HPV; prevnar 20 given today; declines influenza vaccine. Pap smear due; referral placed.  Discussed the importance of a healthy diet and regular exercise in order for weight loss, and to reduce the risk of further co-morbidity.  Exam stable. Labs pending.  Follow up in 1 year for repeat physical.   Orders: -     CBC -     Comprehensive metabolic panel with GFR -     Lipid panel -     TSH  Screening for cervical cancer Assessment & Plan: Referral placed for GYN for IUD management and pap smear.  Orders: -     Ambulatory referral to Gynecology  Class 1 obesity due to excess calories with body mass index (BMI) of 33.0 to 33.9 in adult, unspecified whether serious comorbidity present Assessment & Plan: Discussed the importance of healthy diet and exercise to affect sustainable weight loss.   Referral information given for healthy weight and wellness.   Need for pneumococcal 20-valent conjugate vaccination -     Pneumococcal conjugate vaccine 20-valent  Gastroesophageal reflux disease, unspecified whether esophagitis present Assessment &  Plan: Controlled  Continue omeprazole 20 mg once daily.   Liletta  IUD (intrauterine device) in place since 04/01/2018 Assessment & Plan: Referral placed for IUD management.   Snoring Assessment & Plan: Asked patient to follow up with pulmonologist regarding schedule sleep study.   I personally also sent message to pulmonologist for follow up.    Hypertension, unspecified type Assessment & Plan: Controlled.  Home readings range between 120s- to low 130s systolic and in the 80s diastolic.   Advised to monitor salt intake and exercise.     Return in about 1 year (around 06/01/2025) for physical and fasting labs.SABRA Carrol Aurora, NP

## 2024-06-01 NOTE — Assessment & Plan Note (Signed)
 Controlled.  Home readings range between 120s- to low 130s systolic and in the 80s diastolic.   Advised to monitor salt intake and exercise.

## 2024-06-01 NOTE — Assessment & Plan Note (Signed)
 Referral placed for IUD management.

## 2024-06-01 NOTE — Assessment & Plan Note (Addendum)
 Referral placed for GYN for IUD management and pap smear.

## 2024-06-01 NOTE — Assessment & Plan Note (Signed)
 Controlled.   Continue omeprazole 20 mg once daily.

## 2024-06-02 ENCOUNTER — Encounter: Admitting: General Practice

## 2024-06-14 ENCOUNTER — Telehealth: Payer: Self-pay

## 2024-06-14 NOTE — Telephone Encounter (Signed)
  Attempted to call to reschedule visit tomorrow will send mychart msg  [facility] can we get this hst scheduled.

## 2024-06-15 ENCOUNTER — Ambulatory Visit: Admitting: Nurse Practitioner

## 2024-06-23 ENCOUNTER — Encounter: Payer: Self-pay | Admitting: General Practice

## 2024-06-25 NOTE — Progress Notes (Signed)
 This encounter was created in error - please disregard.

## 2024-07-23 ENCOUNTER — Ambulatory Visit

## 2024-07-30 ENCOUNTER — Encounter: Payer: Self-pay | Admitting: General Practice

## 2024-07-30 ENCOUNTER — Ambulatory Visit: Payer: Self-pay | Admitting: General Practice

## 2024-07-30 ENCOUNTER — Ambulatory Visit: Admitting: General Practice

## 2024-07-30 VITALS — BP 118/80 | HR 102 | Temp 98.1°F | Ht 64.0 in | Wt 192.0 lb

## 2024-07-30 DIAGNOSIS — L299 Pruritus, unspecified: Secondary | ICD-10-CM | POA: Diagnosis not present

## 2024-07-30 DIAGNOSIS — J392 Other diseases of pharynx: Secondary | ICD-10-CM

## 2024-07-30 LAB — POCT INFLUENZA A/B
Influenza A, POC: NEGATIVE
Influenza B, POC: NEGATIVE

## 2024-07-30 LAB — POC COVID19 BINAXNOW: SARS Coronavirus 2 Ag: NEGATIVE

## 2024-07-30 LAB — POCT RAPID STREP A (OFFICE): Rapid Strep A Screen: NEGATIVE

## 2024-07-30 MED ORDER — AZELASTINE HCL 0.1 % NA SOLN: 1.0000 | Freq: Two times a day (BID) | NASAL | 0 refills | Status: DC

## 2024-07-30 MED ORDER — PREDNISONE 20 MG PO TABS: 40.0000 mg | ORAL_TABLET | Freq: Every day | ORAL | 0 refills | Status: AC

## 2024-07-30 NOTE — Patient Instructions (Addendum)
 Start prednisone  20 mg tablets. Take 2 tablets by mouth once daily in the morning for 5 days.   Start Azelastine  nasal spray twice daily.   You will either be contacted via phone regarding your referral to ENT , or you may receive a letter on your MyChart portal from our referral team with instructions for scheduling an appointment. Please let us  know if you have not been contacted by anyone within two weeks.   Please update me if symptoms worsen or do not improve.   It was a pleasure to see you today!

## 2024-07-30 NOTE — Progress Notes (Signed)
 "  Established Patient Office Visit  Subjective   Patient ID: Michele Dixon, female    DOB: 10-15-1995  Age: 29 y.o. MRN: [id number]  Chief Complaint  Patient presents with   Sinus Problem    With itchy throat and itchy ears since October. Patient has tried taking all otc antihistamines but none have worked for her sx. Patient has also had lack of energy for awhile and now a dry cough with they itchy throat. Cough started about a week or so ago.     Sinus Problem Associated symptoms include congestion. Pertinent negatives include no chills, ear pain, headaches or shortness of breath.    Discussed the use of AI scribe software for clinical note transcription with the patient, who gave verbal consent to proceed.  History of Present Illness Michele Dixon is a 29 year old female who presents with persistent itchy throat and ears since October. Language assistance services were used to assist the patient with translation, provided by [employer] [id number]  She has been experiencing an itchy throat and ears since October, accompanied by rhinorrhea and nasal congestion. Despite using Flonase  and a saline nasal cleanser, her symptoms have not improved. She describes the sensation in her throat as feeling like something is stuck, although there is no pain, fever, or chills.  She experienced ear pressure that was painful, but this has since resolved, although the itching persists. She has a history of a cochlear implant but notes that her ears have not itched like this since the implant. She previously used hearing aids, which caused cerumen buildup, but she does not believe this is the current issue.  She has a dry cough and denies any cough with mucus or phlegm. She has not been diagnosed with asthma.      Patient Active Problem List   Diagnosis Date Noted   Ear itching 07/30/2024   Throat irritation 07/30/2024   Encounter for screening and preventative care 06/01/2024   Screening for cervical  cancer 06/01/2024   Obesity 05/05/2024   Hypertension 05/05/2024   Excessive daytime sleepiness 04/26/2024   Snoring 04/21/2024   Gastroesophageal reflux disease 04/21/2024   Liletta  IUD (intrauterine device) in place since 04/01/2018 04/01/2018   Uses sign language interpreter 08/05/2017   Bilateral sensorineural hearing loss 06/07/2013   Cochlear implant in place 01/01/2013   Migraine headache 07/05/2008   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   Past Medical History:  Diagnosis Date   Anemia    Anxiety    Cochlear implant in place    age 56   Exudative pharyngitis 01/01/2013   FUO (fever of unknown origin) 01/01/2013   GERD (gastroesophageal reflux disease)    Infection    UTI   Migraines    Ovarian cyst    ~6th grade   Sensorineural hearing loss (SNHL) of both ears    can read lips but unsure about medical terms   Sleep apnea    Syncope and collapse 01/01/2013   Past Surgical History:  Procedure Laterality Date   COCHLEAR IMPLANT Right age 43   FOREIGN BODY REMOVAL Left 10/29/2016   Procedure: EXCISION FOREIGN BODY LEFT ARM;  Surgeon: Herlene Beverley Bureau, MD;  Location: [facility];  Service: General;  Laterality: Left;   NORPLANT  REMOVAL Left 05/15/2016   Procedure: Attempted REMOVAL OF Nexplanon ;  Surgeon: Harland JAYSON Birkenhead, MD;  Location: [facility];  Service: Gynecology;  Laterality: Left;  Left upper arm.   Allergies[1]  07/30/2024    2:29 PM 05/05/2024   11:20 AM 04/21/2024    8:54 AM  Depression screen PHQ 2/9  Decreased Interest 0 0 2  Down, Depressed, Hopeless 0 0 0  PHQ - 2 Score 0 0 2  Altered sleeping 1 1 1   Tired, decreased energy 1 1 1   Change in appetite 1 1 1   Feeling bad or failure about yourself  0 0 0  Trouble concentrating 0 1 1  Moving slowly or fidgety/restless 1 0 1  Suicidal thoughts 0 0 0  PHQ-9 Score 4 4  7    Difficult doing work/chores Not difficult at all Not difficult at all Not difficult at all     Data saved  with a previous flowsheet row definition       07/30/2024    2:29 PM 05/05/2024   11:21 AM 04/21/2024    8:55 AM 09/18/2021   10:25 AM  GAD 7 : Generalized Anxiety Score  Nervous, Anxious, on Edge 0 0 0 0  Control/stop worrying 0 0 0 0  Worry too much - different things 0 0 0 0  Trouble relaxing 0 0 0 0  Restless 1 0 0 0  Easily annoyed or irritable 1 0 0 0  Afraid - awful might happen 0 0 0 0  Total GAD 7 Score 2 0 0 0  Anxiety Difficulty Not difficult at all Not difficult at all Not difficult at all       Review of Systems  Constitutional:  Negative for chills and fever.  HENT:  Positive for congestion. Negative for ear pain and nosebleeds.        Ear itching.  Respiratory:  Negative for shortness of breath.   Cardiovascular:  Negative for chest pain.  Gastrointestinal:  Negative for abdominal pain, constipation, diarrhea, heartburn, nausea and vomiting.  Genitourinary:  Negative for dysuria, frequency and urgency.  Neurological:  Negative for dizziness and headaches.  Endo/Heme/Allergies:  Negative for polydipsia.  Psychiatric/Behavioral:  Negative for depression and suicidal ideas. The patient is not nervous/anxious.       Objective:     BP 118/80   Pulse (!) 102   Temp 98.1 F (36.7 C) (Temporal)   Ht 5' 4 (1.626 m)   Wt 192 lb (87.1 kg)   SpO2 98%   BMI 32.96 kg/m  BP Readings from Last 3 Encounters:  07/30/24 118/80  06/01/24 110/80  05/05/24 118/84   Wt Readings from Last 3 Encounters:  07/30/24 192 lb (87.1 kg)  06/01/24 195 lb (88.5 kg)  05/05/24 192 lb (87.1 kg)      Physical Exam Vitals and nursing note reviewed.  Constitutional:      Appearance: Normal appearance.  Cardiovascular:     Rate and Rhythm: Normal rate and regular rhythm.     Pulses: Normal pulses.     Heart sounds: Normal heart sounds.  Pulmonary:     Effort: Pulmonary effort is normal.     Breath sounds: Normal breath sounds.  Neurological:     Mental Status: She is alert  and oriented to person, place, and time.  Psychiatric:        Mood and Affect: Mood normal.        Behavior: Behavior normal.        Thought Content: Thought content normal.        Judgment: Judgment normal.      Results for orders placed or performed in visit on 07/30/24  POC COVID-19  Result Value Ref Range   SARS Coronavirus 2 Ag Negative Negative  POCT Influenza A/B  Result Value Ref Range   Influenza A, POC Negative Negative   Influenza B, POC Negative Negative  Rapid Strep A  Result Value Ref Range   Rapid Strep A Screen Negative Negative       The ASCVD Risk score (Arnett DK, et al., 2019) failed to calculate for the following reasons:   The 2019 ASCVD risk score is only valid for ages 2 to 60    Assessment & Plan:  Ear itching -     Ambulatory referral to ENT -     Azelastine  HCl; Place 1 spray into both nostrils 2 (two) times daily. Use in each nostril as directed  Dispense: 30 mL; Refill: 0 -     predniSONE ; Take 2 tablets (40 mg total) by mouth daily for 5 days.  Dispense: 10 tablet; Refill: 0  Throat irritation Assessment & Plan: Covid, flu and rapid strep test negative.   Start prednisone  20 mg tablets. Take 2 tablets by mouth once daily in the morning for 5 days.  Drink plenty of fluids.  Rest.  Use cool mist humidifer.   Orders: -     POC COVID-19 BinaxNow -     POCT Influenza A/B -     Azelastine  HCl; Place 1 spray into both nostrils 2 (two) times daily. Use in each nostril as directed  Dispense: 30 mL; Refill: 0 -     predniSONE ; Take 2 tablets (40 mg total) by mouth daily for 5 days.  Dispense: 10 tablet; Refill: 0 -     POCT rapid strep A    Assessment and Plan Assessment & Plan Chronic rhinitis Persistent itchy throat, ears, and runny nose since October. Previous treatments with Flonase  and saline cleanser were ineffective. Differential includes dry skin or other non-infectious causes. - no red flags on exam today. - Start prednisone  20  mg tablets. Take 2 tablets by mouth once daily in the morning for 5 days.  - Start Azelastine  nasal spray BID. Rx sent. - Referred to ENT specialist in [facility] for further evaluation.   Return if symptoms worsen or fail to improve.    Carrol Aurora, NP     [1] No Known Allergies  "

## 2024-07-30 NOTE — Assessment & Plan Note (Signed)
 Covid, flu and rapid strep test negative.   Start prednisone  20 mg tablets. Take 2 tablets by mouth once daily in the morning for 5 days.  Drink plenty of fluids.  Rest.  Use cool mist humidifer.

## 2024-08-04 ENCOUNTER — Encounter (INDEPENDENT_AMBULATORY_CARE_PROVIDER_SITE_OTHER): Payer: Self-pay

## 2024-08-23 ENCOUNTER — Other Ambulatory Visit: Payer: Self-pay | Admitting: General Practice

## 2024-08-23 DIAGNOSIS — J392 Other diseases of pharynx: Secondary | ICD-10-CM

## 2024-08-23 DIAGNOSIS — L299 Pruritus, unspecified: Secondary | ICD-10-CM

## 2024-08-26 ENCOUNTER — Encounter (INDEPENDENT_AMBULATORY_CARE_PROVIDER_SITE_OTHER): Payer: Self-pay | Admitting: Otolaryngology

## 2024-08-26 ENCOUNTER — Ambulatory Visit (INDEPENDENT_AMBULATORY_CARE_PROVIDER_SITE_OTHER): Admitting: Otolaryngology

## 2024-08-26 VITALS — BP 88/54 | HR 105 | Ht 64.0 in | Wt 195.0 lb

## 2024-08-26 DIAGNOSIS — J343 Hypertrophy of nasal turbinates: Secondary | ICD-10-CM

## 2024-08-26 DIAGNOSIS — R0982 Postnasal drip: Secondary | ICD-10-CM

## 2024-08-26 DIAGNOSIS — H608X3 Other otitis externa, bilateral: Secondary | ICD-10-CM

## 2024-08-26 DIAGNOSIS — K219 Gastro-esophageal reflux disease without esophagitis: Secondary | ICD-10-CM

## 2024-08-26 DIAGNOSIS — R0981 Nasal congestion: Secondary | ICD-10-CM

## 2024-08-26 MED ORDER — AZELASTINE HCL 0.1 % NA SOLN: 2.0000 | Freq: Two times a day (BID) | NASAL | 12 refills | Status: AC

## 2024-08-26 MED ORDER — FLUOCINOLONE ACETONIDE 0.01 % OT OIL: 4.0000 [drp] | TOPICAL_OIL | Freq: Two times a day (BID) | OTIC | 1 refills | Status: AC

## 2024-08-26 MED ORDER — MOMETASONE FUROATE 0.1 % EX CREA: 1.0000 | TOPICAL_CREAM | Freq: Every day | CUTANEOUS | 0 refills | Status: AC

## 2024-08-26 NOTE — Progress Notes (Unsigned)
 Dear Dr. Vincente, Here is my assessment for our mutual patient, Michele Dixon. Thank you for allowing me the opportunity to care for your patient. Please do not hesitate to contact me should you have any other questions. Sincerely, Dr. Eldora Blanch  Otolaryngology Clinic Note Referring provider: Dr. Vincente HPI:  Michele Dixon is a 29 y.o. female kindly referred by Dr. Vincente for evaluation of ear itching  Initial visit (08/2024): Discussed the use of AI scribe software for clinical note transcription with the patient, who gave verbal consent to proceed.  History of Present Illness Michele Dixon is a 29 year old female with congenital sensorineural hearing loss and cochlear implant who presents with persistent upper airway and right ear symptoms.  Since October 2025 she has had fluctuating upper airway symptoms with sinus congestion, clear rhinorrhea, intermittent cough, throat and ear pruritus, globus sensation, and frequent morning expectoration of green-yellow mucus. She also has morning ocular pruritus. She denies odynophagia, dysphagia, facial pain, facial pressure, or purulent nasal drainage. She has not had similar allergy symptoms in the past.  She has intermittent pruritus of the right ear and periauricular area without otalgia, otorrhea, aural fullness, or recurrent otitis media. She occasionally uses hydrogen peroxide in the ear for relief. She uses her cochlear implant intermittently, mainly at work.  She has gastroesophageal reflux with burning in the throat after eating while sitting and associated globus sensation. She takes acid suppression 40 mg twice daily with partial relief. She avoids lying down after meals and notes increased morning throat mucus.  She takes Zyrtec D for allergy and sinus symptoms with good effect but limiting sedation, and also uses fluticasone  nasal spray with additional benefit. She has a pet and notices congestion when cleaning up after the pet. Prior  allergy testing in 2002-2003 showed hazelnut, ragweed, and possible other environmental allergies. She denies chronic nasal symptoms before this current episode. Patient reports: *** Patient denies: ear pain, fullness, vertigo, drainage, tinnitus Patient additionally denies: deep pain in ear canal, eustachian tube symptoms such as popping, crackling, sensitive to pressure changes Patient also denies barotrauma, vestibular suppressant use, ototoxic medication use Prior ear surgery: ***  In person sign language interpreter used.  ENT Surgery: CI - 2006 Personal or FHx of bleeding dz or anesthesia difficulty: no ***  GLP-1: *** AP/AC: ***  Tobacco: ***  PMHx: HTN, Migraine, GERD, SNHL s/p CI  Independent Review of Additional Tests or Records:  Michele Dixon (07/30/2024): itchy throat and ears since October, tried OTC antisthamine and dry cough (x1 week); also with rhinorhea and congestion; something is stuck in throat; Dx: Ear itching, throat irritation; Rx: astelin , Prednisone , ref to ENT Michele Dixon (03/22/2022): b/l SNHL with R CI 2006, device lost and not worn anything and using ASL; N8 upgrade activated 03/15/2022. F/u 3 years Labs CBC and CMP 06/01/2024: BUN/Cr 10/0.7; WBC 6.2 Michele Dixon (03/15/2022): reviewed audio note 2017 Audio visit:  PMH/Meds/All/SocHx/FamHx/ROS:   Past Medical History:  Diagnosis Date   Anemia    Anxiety    Cochlear implant in place    age 53   Exudative pharyngitis 01/01/2013   FUO (fever of unknown origin) 01/01/2013   GERD (gastroesophageal reflux disease)    Infection    UTI   Migraines    Ovarian cyst    ~6th grade   Sensorineural hearing loss (SNHL) of both ears    can read lips but unsure about medical terms   Sleep apnea    Syncope and collapse 01/01/2013  Past Surgical History:  Procedure Laterality Date   COCHLEAR IMPLANT Right age 89   FOREIGN BODY REMOVAL Left 10/29/2016   Procedure: EXCISION FOREIGN BODY LEFT ARM;  Surgeon: Michele Righter Kinsinger, Michele Dixon;  Location: [facility];  Service: General;  Laterality: Left;   NORPLANT  REMOVAL Left 05/15/2016   Procedure: Attempted REMOVAL OF Nexplanon ;  Surgeon: Michele JAYSON Birkenhead, Michele Dixon;  Location: [facility];  Service: Gynecology;  Laterality: Left;  Left upper arm.    Family History  Problem Relation Age of Onset   Hypertension Mother    Hyperlipidemia Father    Prostate cancer Father    Pancreatitis Sister    Pancreatitis Maternal Aunt    Breast cancer Maternal Aunt    Pancreatitis Maternal Grandmother      Social Connections: Moderately Isolated (05/04/2024)   Social Connection and Isolation Panel    Frequency of Communication with Friends and Family: Three times a week    Frequency of Social Gatherings with Friends and Family: Once a week    Attends Religious Services: Patient declined    Database Administrator or Organizations: No    Attends Engineer, Structural: Not on file    Marital Status: Living with partner     Current Medications[1]   Physical Exam:   There were no vitals taken for this visit.  Salient findings:  CN II-XII intact *** Bilateral EAC clear and TM intact with well pneumatized middle ear spaces Weber 512: *** Rinne 512: AC > BC b/l *** Rine 1024: AC > BC b/l *** Anterior rhinoscopy: Septum ***; bilateral inferior turbinates with *** No lesions of oral cavity/oropharynx; dentition *** No obviously palpable neck masses/lymphadenopathy/thyromegaly No respiratory distress or stridor***  Seprately Identifiable Procedures:  Prior to initiating any procedures, risks/benefits/alternatives were explained to the patient and verbal consent obtained. None***  Impression & Plans:  Aurianna Earlywine is a 29 y.o. female with ***  No diagnosis found.   - f/u ***  See below regarding exact medications prescribed this encounter including dosages and route: No orders of the defined types were placed in this encounter.     Thank you  for allowing me the opportunity to care for your patient. Please do not hesitate to contact me should you have any other questions.  Sincerely, Eldora Blanch, Michele Dixon Otolaryngologist (ENT), [facility] Phone: [phone] Fax: [phone]  08/26/2024, 12:43 PM   MDM:  Level *** Complexity/Problems addressed: *** Data complexity: *** independent review of *** - Morbidity: ***  - Prescription Drug prescribed or managed: ***      [1]  Current Outpatient Medications:    Azelastine  HCl 137 MCG/SPRAY SOLN, INHALE 1 SPRAY IN EACH NOSTRIL TWICE DAILY AS DIRECTED, Disp: 30 mL, Rfl: 1   Ferrous Sulfate (IRON PO), Take by mouth., Disp: , Rfl:    levonorgestrel  (LILETTA ) 20.1 MCG/DAY IUD IUD, 1 each by Intrauterine route once., Disp: , Rfl:    MAGNESIUM PO, Take by mouth., Disp: , Rfl:    Multiple Vitamin (MULTIVITAMIN ADULT PO), Take by mouth., Disp: , Rfl:

## 2024-08-26 NOTE — Patient Instructions (Addendum)
 Take reflux gourmet -- take after meals and before bed --- buy on amazon  Take regular zyrtec daily Use two sprays of flonase  in each nostril twice per day  right after, use astelin  spray two sprays each nostril twice per day  For ear, use dermotic  oil 4 drops twice daily for 2 weeks; then put the steroid cream twice daily for 2 weeks.

## 2024-08-30 ENCOUNTER — Institutional Professional Consult (permissible substitution) (INDEPENDENT_AMBULATORY_CARE_PROVIDER_SITE_OTHER): Admitting: Nurse Practitioner

## 2024-10-11 ENCOUNTER — Ambulatory Visit

## 2024-12-30 ENCOUNTER — Ambulatory Visit (INDEPENDENT_AMBULATORY_CARE_PROVIDER_SITE_OTHER): Admitting: Otolaryngology

## 2025-06-02 ENCOUNTER — Encounter: Admitting: General Practice
